# Patient Record
Sex: Male | Born: 1985 | Race: White | Hispanic: No | Marital: Single | Smoking: Current some day smoker
Health system: Southern US, Community
[De-identification: ages and names within clinical notes are randomized; demographics above are authoritative.]

## PROBLEM LIST (undated history)

## (undated) DIAGNOSIS — K589 Irritable bowel syndrome without diarrhea: Secondary | ICD-10-CM

## (undated) DIAGNOSIS — Z8619 Personal history of other infectious and parasitic diseases: Secondary | ICD-10-CM

## (undated) DIAGNOSIS — E781 Pure hyperglyceridemia: Secondary | ICD-10-CM

## (undated) HISTORY — DX: Personal history of other infectious and parasitic diseases: Z86.19

## (undated) HISTORY — DX: Pure hyperglyceridemia: E78.1

## (undated) HISTORY — DX: Irritable bowel syndrome, unspecified: K58.9

---
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
## 2007-04-25 ENCOUNTER — Emergency Department (HOSPITAL_COMMUNITY): Admission: EM | Admit: 2007-04-25 | Discharge: 2007-04-25 | Payer: Self-pay | Admitting: Emergency Medicine

## 2007-10-19 HISTORY — PX: APPENDECTOMY: SHX54

## 2008-09-15 ENCOUNTER — Encounter (INDEPENDENT_AMBULATORY_CARE_PROVIDER_SITE_OTHER): Payer: Self-pay | Admitting: General Surgery

## 2008-09-16 ENCOUNTER — Observation Stay (HOSPITAL_COMMUNITY): Admission: EM | Admit: 2008-09-16 | Discharge: 2008-09-16 | Payer: Self-pay | Admitting: Emergency Medicine

## 2009-11-20 IMAGING — CT CT PELVIS W/ CM
2 of 5 series · 17 of 46 positions shown, 19 images · IV contrast (water/omni  & 100 ML OMNI 300)
Comparison: None.

CT ABDOMEN

CLINICAL DATA: Right lower quadrant pain.

CT ABDOMEN AND PELVIS WITH CONTRAST
TECHNIQUE: Multidetector CT imaging of the abdomen and pelvis was
performed using the standard protocol following bolus
administration of intravenous contrast.
Contrast: Rmnipaque-3NN, 100 ml.

[Series 2: routine abdomen · axial · 0.82mm/px · z∈[-514,-64]mm · 14 of 100 slices shown, 16 images]
[im 5/100  soft-tissue]
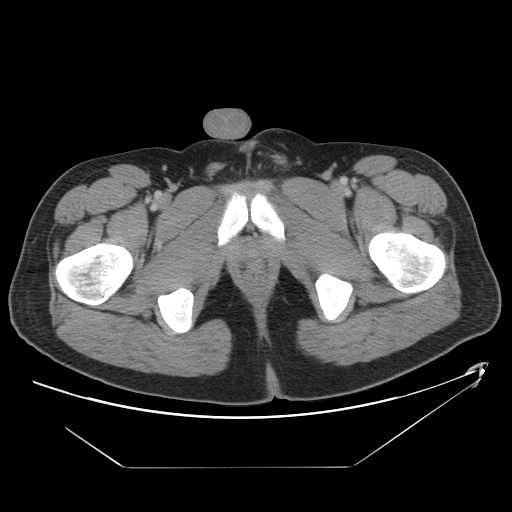
[im 5/100  bone]
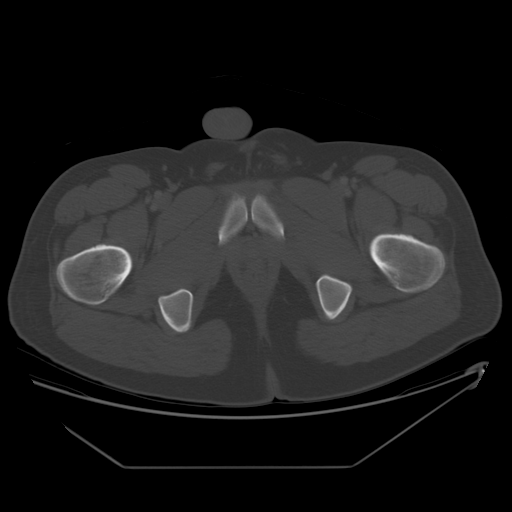
[im 15/100  soft-tissue]
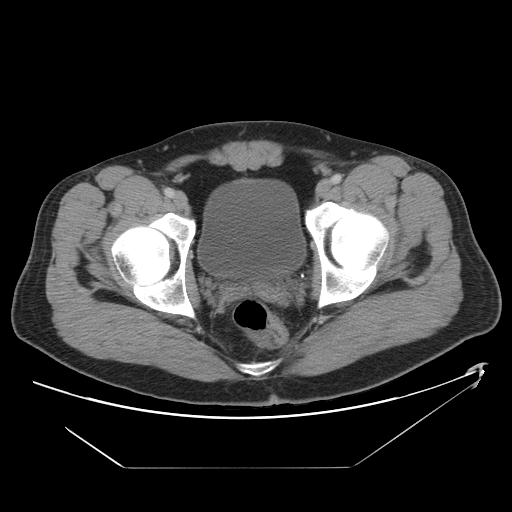
[im 20/100  soft-tissue]
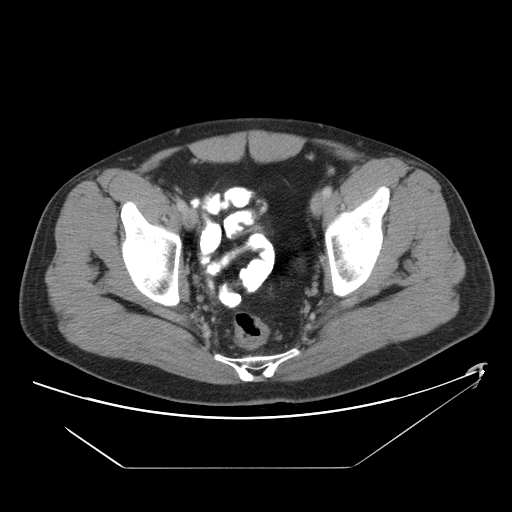
[im 25/100  soft-tissue]
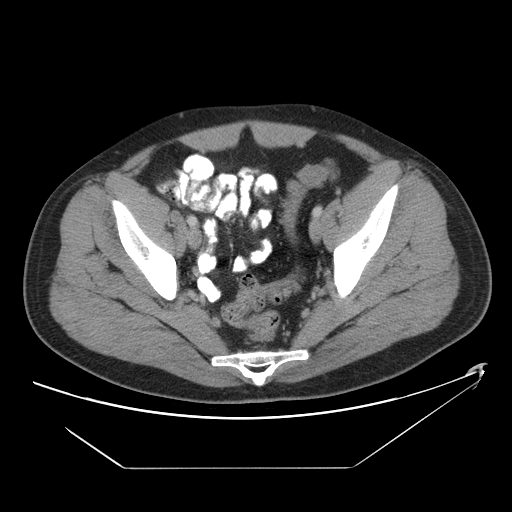
[im 35/100  soft-tissue]
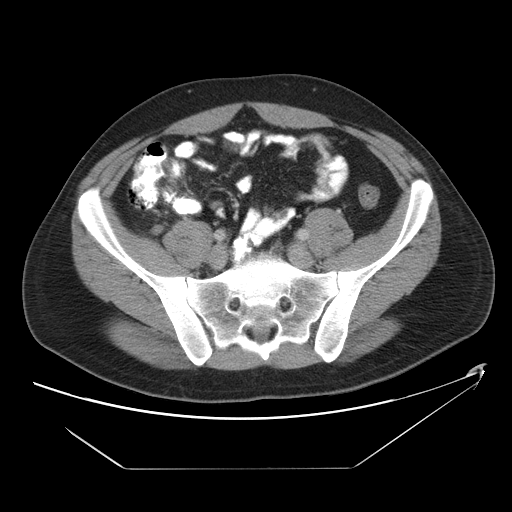
[im 40/100  soft-tissue]
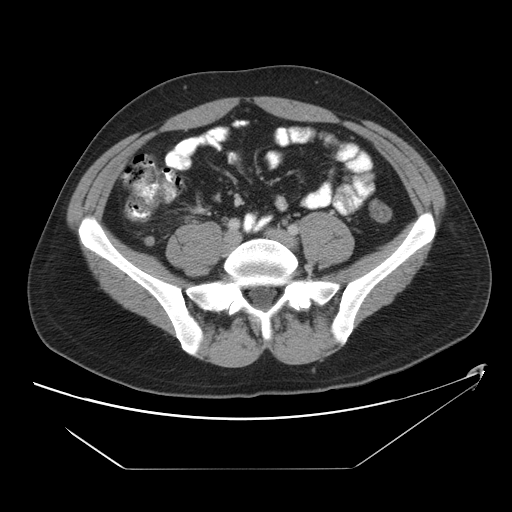
[im 45/100  soft-tissue]
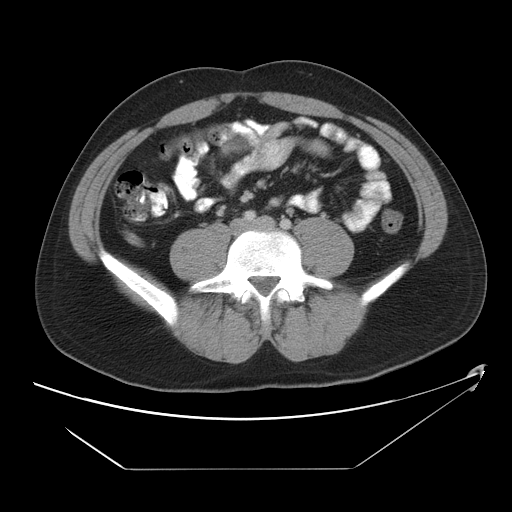
[im 55/100  soft-tissue]
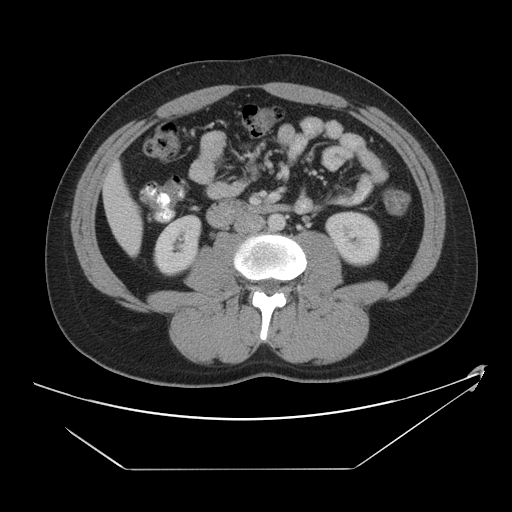
[im 60/100  soft-tissue]
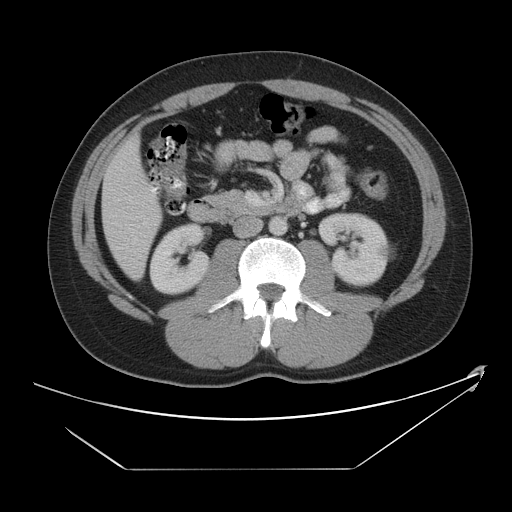
[im 60/100  bone]
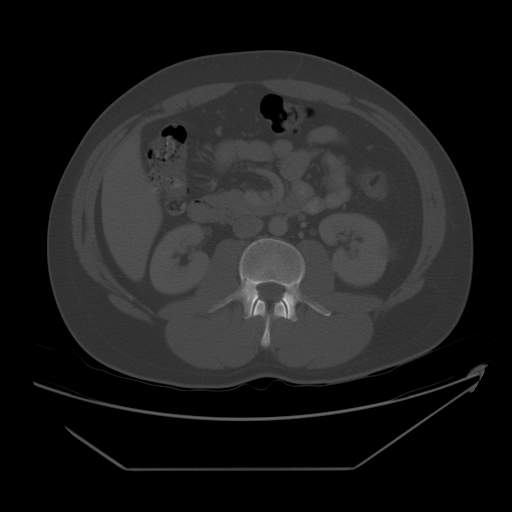
[im 65/100  soft-tissue]
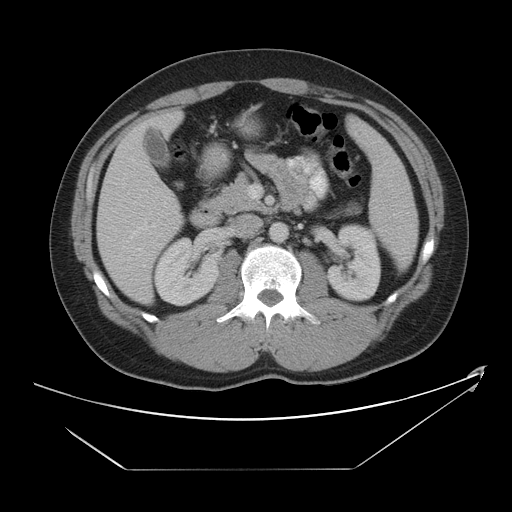
[im 75/100  soft-tissue]
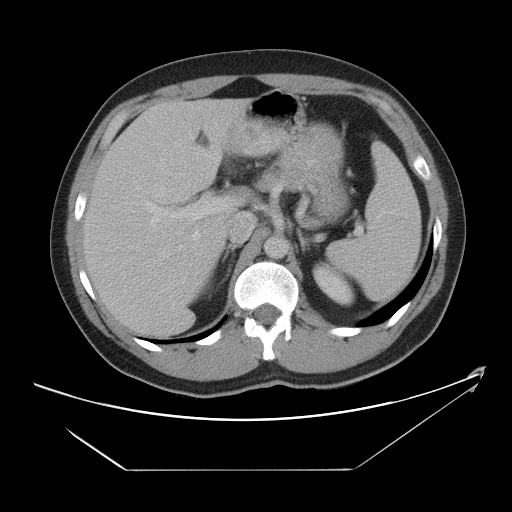
[im 80/100  soft-tissue]
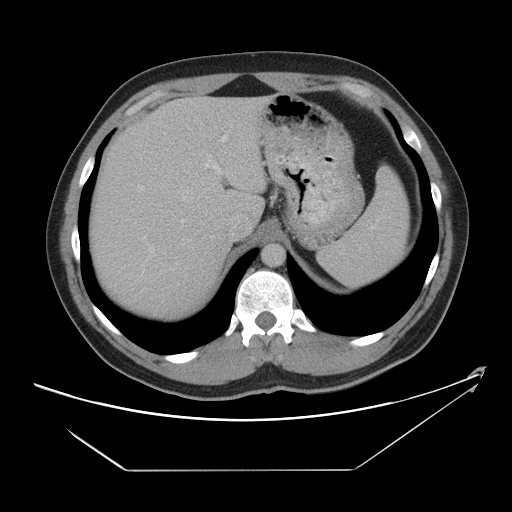
[im 85/100  soft-tissue]
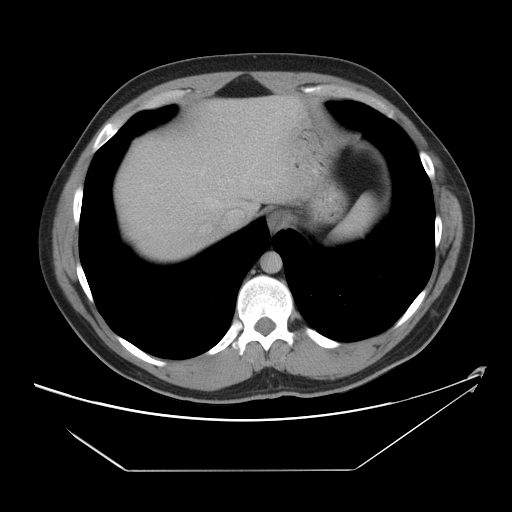
[im 95/100  soft-tissue]
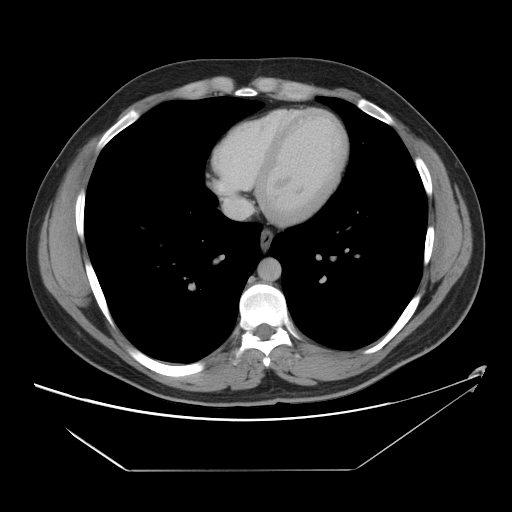

[Series 400: cor · coronal · 0.90mm/px · 3 of 103 slices shown]
[im 35/103  soft-tissue]
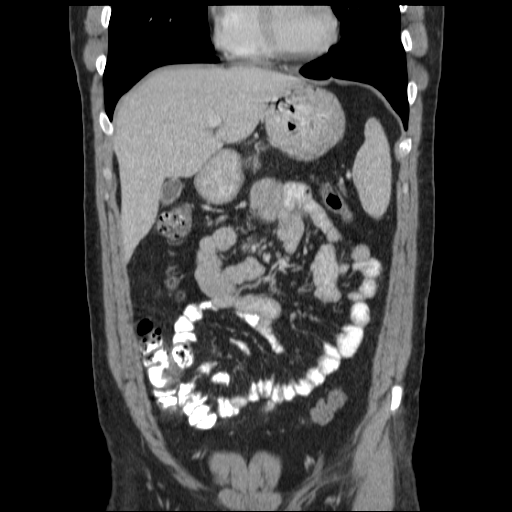
[im 46/103  soft-tissue]
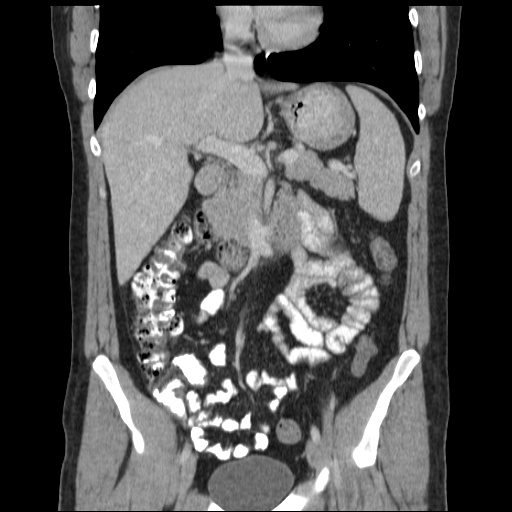
[im 57/103  soft-tissue]
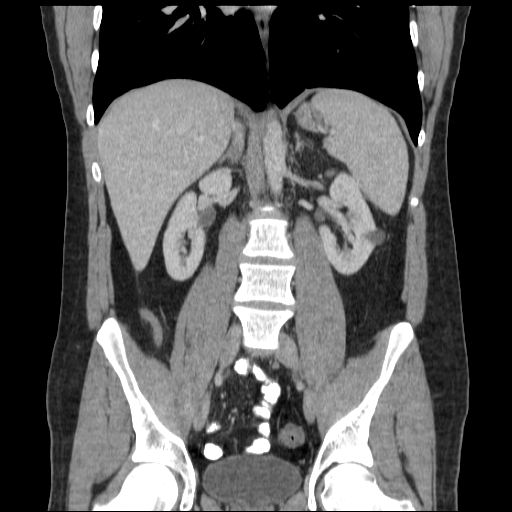

[17 of 46 positions shown; findings below may reference images not displayed]

FINDINGS: Liver, spleen, pancreas, adrenal glands all normal.
Gallbladder normal.  Lung bases clear.  No retroperitoneal vascular
abnormality.  No significant adenopathy.  Incidental 2 cm simple
cyst left kidney. Kidneys otherwise normal. Visualized stomach,
small bowel, colon all normal.  No free fluid or free air.
IMPRESSION: No acute findings.

CT PELVIS
FINDINGS: The appendix is mildly enlarged.  There is early
inflammatory change in the  retrocecal area.  There is no free
fluid or free air.  The findings are consistent with early
appendicitis.  No other bowel abnormalities are seen.  There is no
obstructive uropathy or ureteral calculi.  Bladder and prostate are
unremarkable.  No osseous abnormality.
IMPRESSION: Findings consistent with early appendicitis.

## 2011-03-02 NOTE — Op Note (Signed)
Bradley Holden, Bradley Holden              ACCOUNT NO.:  [id number]   MEDICAL RECORD NO.:  [id number]          PATIENT TYPE:  INP   LOCATION:  5123                         FACILITY:  [facility]   PHYSICIAN:  Ollen Gross. Vernell Morgans, M.D. DATE OF BIRTH:  1986/02/25   DATE OF PROCEDURE:  09/15/2008  DATE OF DISCHARGE:  09/16/2008                               OPERATIVE REPORT   PREOPERATIVE DIAGNOSIS:  Acute appendicitis.   POSTOPERATIVE DIAGNOSIS:  Acute appendicitis.   PROCEDURE:  Laparoscopic appendectomy.   SURGEON:  Ollen Gross. Vernell Morgans, MD   ANESTHESIA:  General endotracheal.   PROCEDURE:  After informed consent was obtained, the patient was brought  to the operating room, placed in a supine position on the operating room  table.  After adequate induction of general anesthesia, the patient's  abdomen was prepped with Betadine and draped in the usual sterile  manner.  The area below the umbilicus was infiltrated with 0.25%  Marcaine.  A small incision was made with a #15 blade knife.  This  incision was carried down through the subcutaneous tissue bluntly with a  hemostat and [employer] retractors until the linea alba was identified.  Linea alba was incised with a #15 blade knife and each side was grasped  with Kocher clamps and elevated anteriorly.  The preperitoneal space was  then probed bluntly with a hemostat until the peritoneum was opened and  access was gained to the abdominal cavity.  A 0 Vicryl pursestring  stitch was placed in the fascia around the opening.  A Hasson cannula  was placed through the opening and anchored in place at the previously  placed Vicryl pursestring stitch.  The abdomen was then insufflated with  carbon dioxide without difficulty.  The patient was placed in  Trendelenburg position, rotated with the right side up.  A laparoscope  was inserted through the Hasson cannula and the right lower quadrant was  inspected.  Next, the suprapubic area was infiltrated with 0.25%  Marcaine.  A small incision was made with a #15 blade knife and a 10-mm  port was placed bluntly through this incision into the abdominal cavity  under direct vision.  Site was chosen between the 2 ports for placement  of a 5-mm port.  This area was infiltrated with 0.25% Marcaine.  A small  stab incision was made with a #15 blade knife and a 5-mm port was placed  bluntly through this incision into the abdominal cavity under direct  vision.  The laparoscope was then moved to the suprapubic port.  Using a  Glassman grasper and harmonic scalpel, the right lower quadrant was  again examined.  The appendix was identified.  The appendix was able to  be elevated anteriorly with the [facility] grasper.  The appendix did  appear to be slightly enlarged and inflamed.  The mesoappendix was taken  down sharply with the harmonic scalpel.  Once the base of the appendix  at its junction with the cecum was cleared of any tissue, then a  laparoscopic GIA 45-mm blue load 6-row stapler was placed through the  Hasson cannula and  placed across the base of the appendix at its  junction with the cecum clamped and fired thereby dividing, thereby  dividing the base of the appendix between staple lines.  A laparoscopic  bag was then inserted through the Hasson cannula.  The appendix was  placed in the bag and the bag was sealed.  The abdomen was then  irrigated with copious amounts of saline until the effluent was clear.  The staple line had a little bit of blood oozing from the staple line.  This was controlled with a couple of endoclips.  Once this was  accomplished, the staple line look good and intact and healthy and  hemostatic.  The appendix was then removed in the bag with the Hasson  cannula through the infraumbilical port without difficulty.  The fascial  defect was then closed with previously placed Vicryl pursestring stitch  as well as with another figure-of-eight 0 Vicryl stitch.  The rest of  the ports  were then removed under direct vision, and the gas was allowed  to escape.  Skin incisions were irrigated with copious amounts of saline  and then closed with interrupted 4-0  Monocryl subcuticular stitches.  Dermabond dressings were applied.  The  patient tolerated the procedure well.  At the end of the case, all  needle, sponge, and instrument counts were correct.  The patient was  then awakened, taken to recovery room in stable condition.      Ollen Gross. Vernell Morgans, M.D.  Electronically Signed     PST/MEDQ  D:  09/16/2008  T:  09/16/2008  Job:  [id number]

## 2011-03-02 NOTE — H&P (Signed)
Bradley Holden, Bradley Holden              ACCOUNT NO.:  [id number]   MEDICAL RECORD NO.:  [id number]          PATIENT TYPE:  INP   LOCATION:  5123                         FACILITY:  [facility]   PHYSICIAN:  Ollen Gross. Vernell Morgans, M.D. DATE OF BIRTH:  [birth year]   DATE OF ADMISSION:  09/15/2008  DATE OF DISCHARGE:                              HISTORY & PHYSICAL   PREOPERATIVE DIAGNOSIS:  Acute appendicitis.   CHIEF COMPLAINT:  Right lower quadrant pain.   Bradley Holden is a 25 year old white male who began having right lower  quadrant abdominal pain this past Friday.  The pain was worse on Friday,  it was associated with some nausea and vomiting.  He denies any fevers  or chills.  No chest pain or shortness of breath.  No diarrhea or  dysuria.  The pain has persisted since Friday, but is not as severe as  it was then.   OTHER REVIEW OF SYSTEMS:  Unremarkable.   PAST MEDICAL HISTORY:  None.   PAST SURGICAL HISTORY:  None.   MEDICATIONS:  None.   ALLERGIES:  None.   SOCIAL HISTORY:  He smokes about a pack of cigarettes a day and  occasionally drinks alcohol.   FAMILY HISTORY:  Noncontributory.   PHYSICAL EXAMINATION:  VITAL SIGNS:  He is afebrile with stable vitals.  GENERAL:  He is a well-developed, well-nourished white male in no acute  distress.  SKIN:  Warm and dry.  No jaundice.  HEENT:  Eyes are anicteric.  Extraocular movements are intact.  Pupils  are equal, round, and reactive to light.  Sclerae nonicteric.  LUNGS:  Clear bilaterally with no use of accessory respiratory muscles.  HEART:  Regular rate and rhythm with impulse in the left chest.  ABDOMEN:  Soft with some mild right lower quadrant tenderness, but no  guarding or peritoneal signs.  No palpable mass or hepatosplenomegaly.  EXTREMITIES:  No cyanosis, clubbing, or edema with good strength in arms  and legs.  PSYCHOLOGIC:  He is alert and oriented x3 with no evidence of anxiety or  depression.   On review of his lab work,  his white count was normal.  On review of his  CT, he did have an enlarged inflamed appendix that appeared to be  retrocecal, no evidence of abscess or perforation.   ASSESSMENT AND PLAN:  This is a 25 year old white male with what appears  to be acute appendicitis.  Because of the risk of perforation and  sepsis, I think he would benefit from having his appendix removed.  He  would also like  to have this done.  I have explained him in detail the risks and  benefits of the operation to remove the appendix as well as some of the  technical aspects, and he understands and wishes to proceed.  We will  obtain some routine preoperative lab work and preparation in doing this  for tonight.      Ollen Gross. Vernell Morgans, M.D.  Electronically Signed     PST/MEDQ  D:  09/15/2008  T:  09/16/2008  Job:  [id number]

## 2011-07-20 LAB — COMPREHENSIVE METABOLIC PANEL
Alkaline Phosphatase: 72 U/L (ref 39–117)
Creatinine, Ser: 0.98 mg/dL (ref 0.4–1.5)
Sodium: 138 mEq/L (ref 135–145)
Total Bilirubin: 0.8 mg/dL (ref 0.3–1.2)

## 2011-07-20 LAB — CBC
Hemoglobin: 16.8 g/dL (ref 13.0–17.0)
MCHC: 34.1 g/dL (ref 30.0–36.0)
Platelets: 167 10*3/uL (ref 150–400)
WBC: 6.2 10*3/uL (ref 4.0–10.5)

## 2011-07-20 LAB — DIFFERENTIAL
Eosinophils Relative: 4 % (ref 0–5)
Lymphocytes Relative: 24 % (ref 12–46)
Lymphs Abs: 1.5 10*3/uL (ref 0.7–4.0)
Monocytes Absolute: 0.5 10*3/uL (ref 0.1–1.0)
Neutro Abs: 3.9 10*3/uL (ref 1.7–7.7)
Neutrophils Relative %: 63 % (ref 43–77)

## 2011-07-20 LAB — LIPASE, BLOOD: Lipase: 22 U/L (ref 11–59)

## 2013-05-30 ENCOUNTER — Ambulatory Visit (INDEPENDENT_AMBULATORY_CARE_PROVIDER_SITE_OTHER): Payer: BC Managed Care – PPO | Admitting: Internal Medicine

## 2013-05-30 ENCOUNTER — Other Ambulatory Visit: Payer: BC Managed Care – PPO

## 2013-05-30 ENCOUNTER — Encounter: Payer: Self-pay | Admitting: Internal Medicine

## 2013-05-30 VITALS — BP 114/82 | HR 73 | Temp 98.0°F | Ht 78.0 in | Wt 236.1 lb

## 2013-05-30 DIAGNOSIS — J029 Acute pharyngitis, unspecified: Secondary | ICD-10-CM

## 2013-05-30 MED ORDER — METHYLPREDNISOLONE ACETATE 80 MG/ML IJ SUSP
80.0000 mg | Freq: Once | INTRAMUSCULAR | Status: AC
  Administered 2013-05-30: 80 mg via INTRAMUSCULAR

## 2013-05-30 NOTE — Addendum Note (Signed)
Addended by: Deatra James on: 05/30/2013 12:15 PM   Modules accepted: Orders

## 2013-05-30 NOTE — Progress Notes (Signed)
Subjective:    Patient ID: Bradley Holden, male    DOB: 08-17-1986, 27 y.o.   MRN: [id number]  HPI  Pt presents to the clinic today with c/o nasal congestion, fatigue and sore throat. The sore throat is mild. He denies fever, chills or body aches. He has been gargling with salt water and taking aspirin. That has helped some. He has no history of allergies or asthma. He has had sick contacts. His main concern is that he lives with his mom who is immunocompromised due to being on methotrexate.   Review of Systems  Past Medical History  Diagnosis Date  . History of chicken pox     No current outpatient prescriptions on file.   No current facility-administered medications for this visit.    No Known Allergies  Family History  Problem Relation Age of Onset  . Arthritis Mother   . Arthritis Father   . Prostate cancer Maternal Grandfather   . Diabetes Other     History   Social History  . Marital Status: Single    Spouse Name: N/A    Number of Children: N/A  . Years of Education: N/A   Occupational History  . Not on file.   Social History Main Topics  . Smoking status: Current Every Day Smoker  . Smokeless tobacco: Not on file  . Alcohol Use: Yes  . Drug Use: No  . Sexual Activity: Not on file   Other Topics Concern  . Not on file   Social History Narrative  . No narrative on file     Constitutional: Pt reports fatigue. Denies fever, malaise, headache or abrupt weight changes.  HEENT: Pt reports sore throat. Denies eye pain, eye redness, ear pain, ringing in the ears, wax buildup, runny nose, nasal congestion, bloody nose. Respiratory: Denies difficulty breathing, shortness of breath, cough or sputum production.   Neurological: Denies dizziness, difficulty with memory, difficulty with speech or problems with balance and coordination.   No other specific complaints in a complete review of systems (except as listed in HPI above).     Objective:   Physical  Exam   BP 114/82  Pulse 73  Temp(Src) 98 F (36.7 C) (Oral)  Ht 6\' 6"  (1.981 m)  Wt 236 lb 1.9 oz (107.103 kg)  BMI 27.29 kg/m2  SpO2 97% Wt Readings from Last 3 Encounters:  05/30/13 236 lb 1.9 oz (107.103 kg)    General: Appears her stated age, well developed, well nourished in NAD. HEENT: Head: normal shape and size; Eyes: sclera white, no icterus, conjunctiva pink, PERRLA and EOMs intact; Ears: Tm's gray and intact, normal light reflex; Nose: mucosa pink and moist, septum midline; Throat/Mouth: Teeth present, mucosa erythematous and moist, no exudate, lesions or ulcerations noted.  Neck: Normal range of motion. Neck supple, trachea midline. No massses, lumps or thyromegaly present.  Cardiovascular: Normal rate and rhythm. S1,S2 noted.  No murmur, rubs or gallops noted. No JVD or BLE edema. No carotid bruits noted. Pulmonary/Chest: Normal effort and positive vesicular breath sounds. No respiratory distress. No wheezes, rales or ronchi noted.   BMET    Component Value Date/Time   NA 138 09/15/2008 1727   K 3.6 09/15/2008 1727   CL 105 09/15/2008 1727   CO2 26 09/15/2008 1727   GLUCOSE 104* 09/15/2008 1727   BUN 13 09/15/2008 1727   CREATININE 0.98 09/15/2008 1727   CALCIUM 9.7 09/15/2008 1727   GFRNONAA >60 09/15/2008 1727   GFRAA  Value: >60  The eGFR has been calculated using the MDRD equation. This calculation has not been validated in all clinical 09/15/2008 1727    Lipid Panel  No results found for this basename: chol, trig, hdl, cholhdl, vldl, ldlcalc    CBC    Component Value Date/Time   WBC 6.2 09/15/2008 1727   RBC 5.41 09/15/2008 1727   HGB 16.8 09/15/2008 1727   HCT 49.4 09/15/2008 1727   PLT 167 09/15/2008 1727   MCV 91.2 09/15/2008 1727   MCHC 34.1 09/15/2008 1727   RDW 11.6 09/15/2008 1727   LYMPHSABS 1.5 09/15/2008 1727   MONOABS 0.5 09/15/2008 1727   EOSABS 0.2 09/15/2008 1727   BASOSABS 0.0 09/15/2008 1727    Hgb A1C No results found  for this basename: HGBA1C        Assessment & Plan:   Acute pharyngitis, likely viral:  Will do throat culture, no RST available Will treat with zpack and 80 mg Depo  RTC as needed

## 2013-05-30 NOTE — Addendum Note (Signed)
Addended by: Deatra James on: 05/30/2013 12:03 PM   Modules accepted: Orders

## 2013-05-30 NOTE — Patient Instructions (Signed)

## 2013-05-31 ENCOUNTER — Other Ambulatory Visit: Payer: Self-pay | Admitting: Internal Medicine

## 2013-06-01 ENCOUNTER — Telehealth: Payer: Self-pay | Admitting: Internal Medicine

## 2013-06-01 LAB — CULTURE, GROUP A STREP

## 2013-06-01 NOTE — Telephone Encounter (Signed)
Pt called request result for throat culture that was done 05/30/13. Ermalinda Barrios spoke with Mr. Boen and inform of preliminary result. Please call pt once the final result is back.

## 2013-06-01 NOTE — Telephone Encounter (Signed)
I spoke with the patient and gave the preliminary result of the Strep A test of of no suspicious colonies, not showing positive for Strep A at this time,  but that he will be called when the final result is received. He states that he is feeling better.

## 2013-07-17 ENCOUNTER — Ambulatory Visit: Payer: Self-pay | Admitting: Internal Medicine

## 2013-09-18 ENCOUNTER — Ambulatory Visit (INDEPENDENT_AMBULATORY_CARE_PROVIDER_SITE_OTHER): Payer: BC Managed Care – PPO | Admitting: Family Medicine

## 2013-09-18 ENCOUNTER — Encounter: Payer: Self-pay | Admitting: Family Medicine

## 2013-09-18 VITALS — BP 110/70 | HR 83 | Temp 98.3°F | Ht 78.0 in | Wt 253.0 lb

## 2013-09-18 DIAGNOSIS — R109 Unspecified abdominal pain: Secondary | ICD-10-CM

## 2013-09-18 DIAGNOSIS — K3189 Other diseases of stomach and duodenum: Secondary | ICD-10-CM

## 2013-09-18 DIAGNOSIS — R1013 Epigastric pain: Secondary | ICD-10-CM

## 2013-09-18 LAB — CBC WITH DIFFERENTIAL/PLATELET
Basophils Absolute: 0 10*3/uL (ref 0.0–0.1)
Basophils Relative: 0.4 % (ref 0.0–3.0)
Eosinophils Absolute: 0.2 10*3/uL (ref 0.0–0.7)
Eosinophils Relative: 4.1 % (ref 0.0–5.0)
HCT: 45.5 % (ref 39.0–52.0)
Hemoglobin: 15.8 g/dL (ref 13.0–17.0)
Lymphocytes Relative: 28.2 % (ref 12.0–46.0)
Lymphs Abs: 1.6 10*3/uL (ref 0.7–4.0)
MCHC: 34.8 g/dL (ref 30.0–36.0)
MCV: 88.6 fl (ref 78.0–100.0)
Monocytes Absolute: 0.6 10*3/uL (ref 0.1–1.0)
Monocytes Relative: 11.5 % (ref 3.0–12.0)
Neutro Abs: 3.1 10*3/uL (ref 1.4–7.7)
Neutrophils Relative %: 55.8 % (ref 43.0–77.0)
Platelets: 179 10*3/uL (ref 150.0–400.0)
RBC: 5.14 Mil/uL (ref 4.22–5.81)
RDW: 12.6 % (ref 11.5–14.6)
WBC: 5.5 10*3/uL (ref 4.5–10.5)

## 2013-09-18 LAB — BASIC METABOLIC PANEL
BUN: 13 mg/dL (ref 6–23)
CO2: 25 mEq/L (ref 19–32)
Calcium: 9.3 mg/dL (ref 8.4–10.5)
Chloride: 104 mEq/L (ref 96–112)
Creatinine, Ser: 1.1 mg/dL (ref 0.4–1.5)
GFR: 89.11 mL/min (ref >60.00)
Glucose, Bld: 89 mg/dL (ref 70–99)
Potassium: 4 mEq/L (ref 3.5–5.1)
Sodium: 137 mEq/L (ref 135–145)

## 2013-09-18 LAB — HEPATIC FUNCTION PANEL
ALT: 50 U/L (ref 0–53)
AST: 30 U/L (ref 0–37)
Albumin: 4.3 g/dL (ref 3.5–5.2)
Alkaline Phosphatase: 62 U/L (ref 39–117)
Bilirubin, Direct: 0 mg/dL (ref 0.0–0.3)
Total Bilirubin: 0.7 mg/dL (ref 0.3–1.2)
Total Protein: 7.3 g/dL (ref 6.0–8.3)

## 2013-09-18 MED ORDER — OMEPRAZOLE 40 MG PO CPDR: 40.0000 mg | DELAYED_RELEASE_CAPSULE | Freq: Every day | ORAL | Status: DC

## 2013-09-18 NOTE — Progress Notes (Signed)
  Subjective:     Bradley Holden is a 27 y.o. male who presents for evaluation of abdominal pain. Onset was 2 months ago. Symptoms have been gradually worsening. The pain is described as burning, and is 6/10 in intensity. Pain is located in the mid, low abd without radiation.  Aggravating factors: eating.  Alleviating factors: none. Associated symptoms: belching. The patient denies anorexia, arthralagias, chills, constipation, diarrhea, dysuria, fever, flatus, frequency, headache, hematochezia, hematuria, melena, myalgias, nausea, sweats and vomiting.  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     The patient's history has been marked as reviewed and updated as appropriate.  Review of Systems Pertinent items are noted in HPI.     Objective:    BP 110/70  Pulse 83  Temp(Src) 98.3 F (36.8 C) (Oral)  Ht 6\' 6"  (1.981 m)  Wt 253 lb (114.76 kg)  BMI 29.24 kg/m2  SpO2 98% General appearance: alert, cooperative, appears stated age and no distress Lungs: clear to auscultation bilaterally Heart: S1, S2 normal Abdomen: soft, non-tender; bowel sounds normal; no masses,  no organomegaly    Assessment:    Abdominal pain, likely secondary to gerd .    Plan:    omeprazole daily Check labs Bland diet Check labs

## 2013-09-18 NOTE — Progress Notes (Signed)
Pre visit review using our clinic review tool, if applicable. No additional management support is needed unless otherwise documented below in the visit note. 

## 2013-09-18 NOTE — Patient Instructions (Signed)

## 2013-11-20 ENCOUNTER — Telehealth: Payer: Self-pay

## 2013-11-20 NOTE — Telephone Encounter (Signed)
Medication List and allergies:  Updated and Reviewed  90 day supply/mail order: n/a Local prescriptions:  [id number] - [place], [place] - [address]  Immunization due:  Influenza- Declined  A/P: No changes to personal, family or PSH Flu- declined Tdap- 2008 per patient    To discuss with provider: Patient currently experience back pain.  May have a pulled muscle per patient.

## 2013-11-21 ENCOUNTER — Encounter (INDEPENDENT_AMBULATORY_CARE_PROVIDER_SITE_OTHER): Payer: Self-pay

## 2013-11-21 ENCOUNTER — Encounter: Payer: Self-pay | Admitting: Family Medicine

## 2013-11-21 ENCOUNTER — Ambulatory Visit (INDEPENDENT_AMBULATORY_CARE_PROVIDER_SITE_OTHER): Payer: No Typology Code available for payment source | Admitting: Family Medicine

## 2013-11-21 VITALS — BP 110/72 | HR 88 | Temp 98.4°F | Resp 16 | Ht 77.75 in | Wt 237.4 lb

## 2013-11-21 DIAGNOSIS — M5416 Radiculopathy, lumbar region: Secondary | ICD-10-CM | POA: Insufficient documentation

## 2013-11-21 DIAGNOSIS — Z0000 Encounter for general adult medical examination without abnormal findings: Secondary | ICD-10-CM

## 2013-11-21 DIAGNOSIS — IMO0002 Reserved for concepts with insufficient information to code with codable children: Secondary | ICD-10-CM

## 2013-11-21 MED ORDER — PREDNISONE 10 MG PO TABS: ORAL_TABLET | ORAL | Status: DC

## 2013-11-21 NOTE — Patient Instructions (Signed)
Follow up as needed Start the Prednisone as directed- take w/ food We'll notify you of your lab results and make any changes if needed Call with any questions or concerns- particularly if the back is not getting better Welcome!  We're glad to have you!

## 2013-11-21 NOTE — Progress Notes (Signed)
Pre visit review using our clinic review tool, if applicable. No additional management support is needed unless otherwise documented below in the visit note. 

## 2013-11-21 NOTE — Progress Notes (Signed)
   Subjective:    Patient ID: Bradley Holden, male    DOB: 1986-10-18, 28 y.o.   MRN: [id number]  HPI New to establish.  [facility].  Wanted to see MD but had to reschedule new pt appt and then panel was closed.  CPE- exercising regularly but recently injured back (has done this previously and dx'd w/ '2 slipped discs').  R sided pain, radiating into back of leg.  Painful to sit.  Able to sleep on side comfortably.   Review of Systems Patient reports no vision/hearing changes, anorexia, fever ,adenopathy, persistant/recurrent hoarseness, swallowing issues, chest pain, palpitations, edema, persistant/recurrent cough, hemoptysis, dyspnea (rest,exertional, paroxysmal nocturnal), gastrointestinal  bleeding (melena, rectal bleeding), abdominal pain, excessive heart burn, GU symptoms (dysuria, hematuria, voiding/incontinence issues) syncope, focal weakness, memory loss, numbness & tingling, skin/hair/nail changes, depression, anxiety, abnormal bruising/bleeding.     Objective:   Physical Exam BP 110/72  Pulse 88  Temp(Src) 98.4 F (36.9 C) (Oral)  Resp 16  Ht 6' 5.75" (1.975 m)  Wt 237 lb 6 oz (107.673 kg)  BMI 27.60 kg/m2  SpO2 98%  General Appearance:    Alert, cooperative, no distress, appears stated age  Head:    Normocephalic, without obvious abnormality, atraumatic  Eyes:    PERRL, conjunctiva/corneas clear, EOM's intact, fundi    benign, both eyes       Ears:    Normal TM's and external ear canals, both ears  Nose:   Nares normal, septum midline, mucosa normal, no drainage   or sinus tenderness  Throat:   Lips, mucosa, and tongue normal; teeth and gums normal  Neck:   Supple, symmetrical, trachea midline, no adenopathy;       thyroid:  No enlargement/tenderness/nodules  Back:     Symmetric, no curvature, ROM normal, no CVA tenderness  Lungs:     Clear to auscultation bilaterally, respirations unlabored  Chest wall:    No tenderness or deformity  Heart:     Regular rate and rhythm, S1 and S2 normal, no murmur, rub   or gallop  Abdomen:     Soft, non-tender, bowel sounds active all four quadrants,    no masses, no organomegaly  Genitalia:    Normal male without lesion, discharge or tenderness  Rectal:    Deferred due to young age  Extremities:   Extremities normal, atraumatic, no cyanosis or edema  Pulses:   2+ and symmetric all extremities  Skin:   Skin color, texture, turgor normal, no rashes or lesions  Lymph nodes:   Cervical, supraclavicular, and axillary nodes normal  Neurologic:   CNII-XII intact. Normal strength, sensation and reflexes      Throughout.  (-) SLR bilaterally          Assessment & Plan:

## 2013-11-22 LAB — HEPATIC FUNCTION PANEL
ALT: 49 U/L (ref 0–53)
AST: 31 U/L (ref 0–37)
Albumin: 4.7 g/dL (ref 3.5–5.2)
Alkaline Phosphatase: 76 U/L (ref 39–117)
Bilirubin, Direct: 0 mg/dL (ref 0.0–0.3)
TOTAL PROTEIN: 7.3 g/dL (ref 6.0–8.3)
Total Bilirubin: 0.5 mg/dL (ref 0.3–1.2)

## 2013-11-22 LAB — CBC WITH DIFFERENTIAL/PLATELET
BASOS ABS: 0 10*3/uL (ref 0.0–0.1)
BASOS PCT: 0.4 % (ref 0.0–3.0)
EOS ABS: 0.2 10*3/uL (ref 0.0–0.7)
Eosinophils Relative: 2.6 % (ref 0.0–5.0)
HEMATOCRIT: 47.6 % (ref 39.0–52.0)
HEMOGLOBIN: 16.3 g/dL (ref 13.0–17.0)
Lymphocytes Relative: 28.1 % (ref 12.0–46.0)
Lymphs Abs: 2.2 10*3/uL (ref 0.7–4.0)
MCHC: 34.3 g/dL (ref 30.0–36.0)
MCV: 90.8 fl (ref 78.0–100.0)
Monocytes Absolute: 0.4 10*3/uL (ref 0.1–1.0)
Monocytes Relative: 5.2 % (ref 3.0–12.0)
NEUTROS PCT: 63.7 % (ref 43.0–77.0)
Neutro Abs: 4.9 10*3/uL (ref 1.4–7.7)
Platelets: 209 10*3/uL (ref 150.0–400.0)
RBC: 5.25 Mil/uL (ref 4.22–5.81)
RDW: 12.2 % (ref 11.5–14.6)
WBC: 7.7 10*3/uL (ref 4.5–10.5)

## 2013-11-22 LAB — BASIC METABOLIC PANEL
BUN: 12 mg/dL (ref 6–23)
CALCIUM: 9.7 mg/dL (ref 8.4–10.5)
CO2: 27 mEq/L (ref 19–32)
CREATININE: 1.1 mg/dL (ref 0.4–1.5)
Chloride: 105 mEq/L (ref 96–112)
GFR: 84.35 mL/min (ref >60.00)
GLUCOSE: 88 mg/dL (ref 70–99)
Potassium: 3.9 mEq/L (ref 3.5–5.1)
SODIUM: 139 meq/L (ref 135–145)

## 2013-11-22 LAB — TSH: TSH: 0.75 u[IU]/mL (ref 0.35–5.50)

## 2013-11-22 LAB — LDL CHOLESTEROL, DIRECT: LDL DIRECT: 92.7 mg/dL

## 2013-11-22 LAB — LIPID PANEL
CHOL/HDL RATIO: 7
CHOLESTEROL: 193 mg/dL (ref 0–200)
HDL: 26.9 mg/dL — AB (ref >39.00)
TRIGLYCERIDES: 705 mg/dL — AB (ref 0.0–149.0)
VLDL: 141 mg/dL — ABNORMAL HIGH (ref 0.0–40.0)

## 2013-11-23 ENCOUNTER — Telehealth: Payer: Self-pay | Admitting: Family Medicine

## 2013-11-23 NOTE — Telephone Encounter (Signed)
Relevant patient education assigned to patient using Emmi. ° °

## 2013-11-25 NOTE — Assessment & Plan Note (Signed)
Pt's PE WNL w/ exception of lumbar radiculopathy.  Check labs.  Anticipatory guidance provided.

## 2013-11-25 NOTE — Assessment & Plan Note (Signed)
New.  Start pred taper and muscle relaxer.  If no improvement will need referral to ortho for ongoing evaluation and tx.  Reviewed supportive care and red flags that should prompt return.  Pt expressed understanding and is in agreement w/ plan.

## 2013-11-26 ENCOUNTER — Other Ambulatory Visit: Payer: Self-pay | Admitting: General Practice

## 2013-11-26 ENCOUNTER — Encounter: Payer: Self-pay | Admitting: General Practice

## 2013-11-26 DIAGNOSIS — E781 Pure hyperglyceridemia: Secondary | ICD-10-CM

## 2013-11-26 MED ORDER — FENOFIBRATE 160 MG PO TABS: 160.0000 mg | ORAL_TABLET | Freq: Every day | ORAL | Status: DC

## 2013-12-12 ENCOUNTER — Ambulatory Visit (INDEPENDENT_AMBULATORY_CARE_PROVIDER_SITE_OTHER): Payer: No Typology Code available for payment source | Admitting: Nurse Practitioner

## 2013-12-12 ENCOUNTER — Encounter: Payer: Self-pay | Admitting: Nurse Practitioner

## 2013-12-12 VITALS — BP 105/74 | HR 81 | Temp 98.2°F | Wt 233.2 lb

## 2013-12-12 DIAGNOSIS — R140 Abdominal distension (gaseous): Secondary | ICD-10-CM | POA: Insufficient documentation

## 2013-12-12 DIAGNOSIS — R142 Eructation: Secondary | ICD-10-CM

## 2013-12-12 DIAGNOSIS — E781 Pure hyperglyceridemia: Secondary | ICD-10-CM | POA: Insufficient documentation

## 2013-12-12 DIAGNOSIS — R143 Flatulence: Secondary | ICD-10-CM

## 2013-12-12 DIAGNOSIS — R141 Gas pain: Secondary | ICD-10-CM

## 2013-12-12 LAB — AMYLASE: Amylase: 33 U/L (ref 27–131)

## 2013-12-12 LAB — LIPASE: Lipase: 31 U/L (ref 11.0–59.0)

## 2013-12-12 MED ORDER — SIMETHICONE 80 MG PO CHEW: 80.0000 mg | CHEWABLE_TABLET | Freq: Four times a day (QID) | ORAL | Status: DC | PRN

## 2013-12-12 NOTE — Patient Instructions (Signed)
I am not certain what is causing your discomfort. A few possibilities are lactose intolerance, wheat intolerance, pancreatitis. Lab tests and ultrasound will help to make diagnosis. In meantime, please do not eat dairy products-no creamy soups, milk, cheese.  Regarding high triglycerides, cut back on alcohol intake, sugar and refined flours. Consider starting fish oil capsules 2-3 capsules daily to get 2000 mg EPA/DHA (read label). Consider starting medication prescribed by Dr Beverely Lowabori.  Lactose Intolerance, Adult Lactose intolerance is when the body is not able to digest lactose, a sugar found in milk and milk products. Lactose intolerance is caused by your body not producing enough of the enzyme lactase. When there is not enough lactase to digest the amount of lactose consumed, discomfort may be felt. Lactose intolerance is not a milk allergy. For most people, lactase deficiency is a condition that develops naturally over time. After about the age of 2, the body begins to produce less lactase. But many people may not experience symptoms until they are much older. CAUSES Things that can cause you to be lactose intolerant include:  Aging.  Being born without the ability to make lactase.  Certain digestive diseases.  Injuries to the small intestine. SYMPTOMS   Feeling sick to your stomach (nauseous).  Diarrhea.  Cramps.  Bloating.  Gas. Symptoms usually show up a half hour or 2 hours after eating or drinking products containing lactose. TREATMENT  No treatment can improve the body's ability to produce lactase. However, symptoms can be controlled through diet. A medicine may be given to you to take when you consume lactose-containing foods or drinks. The medicine contains the lactase enzyme, which help the body digest lactose better. HOME CARE INSTRUCTIONS  Eat or drink dairy products as told by your caregiver or dietician.  Take all medicine as directed by your caregiver.  Find  lactose-free or lactose-reduced products at your local grocery store.  Talk to your caregiver or dietician to decide if you need any dietary supplements. The following is the amount of calcium needed from the diet:  19 to 50 years: 1000 mg  Over 50 years: 1200 mg Calcium and Lactose in Common Foods Non-Dairy Products / Calcium Content (mg)  Calcium-fortified orange juice, 1 cup / 308 to 344 mg  Sardines, with edible bones, 3 oz / 270 mg  Salmon, canned, with edible bones, 3 oz / 205 mg  Soymilk, fortified, 1 cup / 200 mg  Broccoli (raw), 1 cup / 90 mg  Orange, 1 medium / 50 mg  Pinto beans,  cup / 40 mg  Tuna, canned, 3 oz / 10 mg  Lettuce greens,  cup / 10 mg Dairy Products / Calcium Content (mg) / Lactose Content (g)  Yogurt, plain, low-fat, 1 cup / 415 mg / 5 g  Milk, reduced fat, 1 cup / 295 mg / 11 g  Swiss cheese, 1 oz / 270 mg / 1 g  Ice cream,  cup / 85 mg / 6 g  Cottage cheese,  cup / 75 mg / 2 to 3 g SEEK MEDICAL CARE IF: You have no relief from your symptoms. Document Released: 10/04/2005 Document Revised: 12/27/2011 Document Reviewed: 01/01/2011 [facility] Patient Information 2014 [place], [place].

## 2013-12-12 NOTE — Assessment & Plan Note (Signed)
Adv to cut back ETOH, sugar, refined flour. Consider 2-3 capsules fish oil daily or eat 6 oz salmon weekly; & Start medication prescribed.

## 2013-12-12 NOTE — Progress Notes (Signed)
   Subjective:    Patient ID: Bradley Holden, male    DOB: 07/13/1986, 28 y.o.   MRN: [id number]  Abdominal Pain This is a recurrent problem. The current episode started in the past 7 days (4 da. Pt states similar to bloating he had last Dec. , but pain is different-like pain he had before appendix was removed.). The onset quality is sudden (felt tired Sat. then woke w/abd pain Sunday, progressed to bloating. Pain has eased, still feels bloated, has discomfort w/movement.). The problem occurs constantly. The most recent episode lasted 3 days. The problem has been gradually improving. The pain is located in the generalized abdominal region. The pain is moderate. The quality of the pain is aching. The abdominal pain does not radiate. Associated symptoms include anorexia, belching, diarrhea (loose stools 3d) and flatus. Pertinent negatives include no arthralgias, constipation, dysuria, fever, headaches, hematochezia, melena, myalgias, nausea or vomiting. The pain is aggravated by movement. The pain is relieved by being still. He has tried nothing for the symptoms. His past medical history is significant for abdominal surgery (appendectomy) and GERD (started omeprazole 7562m ago).      Review of Systems  Constitutional: Positive for appetite change (eating mostly soups w/some solids, drinking milk (instant breakfast), sweet tea). Negative for fever, chills, activity change and fatigue.  HENT: Negative for congestion and sore throat.   Respiratory: Negative for cough, chest tightness, shortness of breath and wheezing.   Cardiovascular: Negative for chest pain.  Gastrointestinal: Positive for abdominal pain, diarrhea (loose stools 3d), abdominal distention, anorexia and flatus. Negative for nausea, vomiting, constipation, melena and hematochezia.  Genitourinary: Negative for dysuria, flank pain and testicular pain.  Musculoskeletal: Negative for arthralgias, back pain and myalgias.  Neurological: Negative  for headaches.  Hematological: Negative for adenopathy.  Psychiatric/Behavioral:       Admits to binge drinking -5 beers & 5 shots liquor every Friday & sundays during football season.       Objective:   Physical Exam  Vitals reviewed. Constitutional: He is oriented to person, place, and time. He appears well-developed and well-nourished. No distress.  HENT:  Head: Normocephalic and atraumatic.  Right Ear: External ear normal.  Left Ear: External ear normal.  Eyes: Conjunctivae are normal. Right eye exhibits no discharge. Left eye exhibits no discharge.  Cardiovascular: Normal rate.   Pulmonary/Chest: Effort normal.  Abdominal: Soft. He exhibits distension. He exhibits no mass. There is tenderness (low abd tenderness RLQ, suprapubic, LLQ). There is no rebound and no guarding.  Neurological: He is alert and oriented to person, place, and time.  Skin: Skin is warm and dry.  Psychiatric: He has a normal mood and affect. His behavior is normal. Thought content normal.          Assessment & Plan:  1. Abdominal bloating 3d, loose stools, sudden abd pain , resolving DD: lactose intolerance, celiac, viral gastroenteritis, food poisoning, IBD, IBS, Pancreatitis (high triglyceridemia) - Fecal lactoferrin - Hemoccult Cards (X3 cards); Future - Amylase - Lipase - Tissue Transglutaminase, IGA - Tissue Transglutaminase, IGG - US Abdomen Complete; Future - simethicone (GAS-X) 80 MG chewable tablet; Chew 1 tablet (80 mg total) by mouth every 6 (six) hours as needed for flatulence.  Dispense: 30 tablet; Refill: 0  F/u 2 weeks. See pt instructions.  2 hypertiglyceridemia See A&P in prob list.

## 2013-12-12 NOTE — Progress Notes (Signed)
Pre visit review using our clinic review tool, if applicable. No additional management support is needed unless otherwise documented below in the visit note. 

## 2013-12-13 LAB — TISSUE TRANSGLUTAMINASE, IGA: TISSUE TRANSGLUTAMINASE AB, IGA: 4.8 U/mL (ref <20)

## 2013-12-13 LAB — TISSUE TRANSGLUTAMINASE, IGG: Tissue Transglut Ab: 4.8 U/mL (ref <20)

## 2013-12-14 ENCOUNTER — Telehealth: Payer: Self-pay | Admitting: Family Medicine

## 2013-12-14 NOTE — Telephone Encounter (Signed)
Relevant patient education assigned to patient using Emmi. ° °

## 2013-12-17 ENCOUNTER — Ambulatory Visit
Admission: RE | Admit: 2013-12-17 | Discharge: 2013-12-17 | Disposition: A | Discharge status: Home / Self Care | Payer: No Typology Code available for payment source | Source: Ambulatory Visit | Attending: Nurse Practitioner | Admitting: Nurse Practitioner

## 2013-12-17 ENCOUNTER — Telehealth: Payer: Self-pay | Admitting: Nurse Practitioner

## 2013-12-17 DIAGNOSIS — G8929 Other chronic pain: Secondary | ICD-10-CM

## 2013-12-17 DIAGNOSIS — R1031 Right lower quadrant pain: Principal | ICD-10-CM

## 2013-12-17 DIAGNOSIS — R140 Abdominal distension (gaseous): Secondary | ICD-10-CM

## 2013-12-18 NOTE — Telephone Encounter (Signed)
Patient has an appt 12/25/13

## 2013-12-25 ENCOUNTER — Ambulatory Visit (INDEPENDENT_AMBULATORY_CARE_PROVIDER_SITE_OTHER): Payer: No Typology Code available for payment source | Admitting: General Surgery

## 2013-12-25 ENCOUNTER — Encounter (INDEPENDENT_AMBULATORY_CARE_PROVIDER_SITE_OTHER): Payer: Self-pay | Admitting: General Surgery

## 2013-12-25 VITALS — BP 130/76 | HR 80 | Temp 98.5°F | Ht 78.0 in | Wt 234.0 lb

## 2013-12-25 DIAGNOSIS — K824 Cholesterolosis of gallbladder: Secondary | ICD-10-CM

## 2013-12-25 NOTE — Progress Notes (Signed)
Patient ID: Bradley Holden, male   DOB: September 06, 1986, 28 y.o.   MRN: [id number]  Chief Complaint  Patient presents with  . Abdominal Pain    new pt    HPI Bradley Holden is a 28 y.o. male.  We're asked to see the patient in consultation by Dr. Beverely Lowabori to evaluate him for a gallbladder polyp. The patient is a 28 year old white male who first started developing bloating around Thanksgiving. The bloating occurs pretty much every day. He has discomfort that comes and goes with that. The discomfort is mostly in his left lower quadrant. He denies any nausea or vomiting. As part of his workup he underwent an ultrasound that showed 2 gallbladder polyps but no gallstones and no inflammation of the gallbladder wall and no ductal dilatation.  HPI  Past Medical History  Diagnosis Date  . History of chicken pox   . IBS (irritable bowel syndrome)     Past Surgical History  Procedure Laterality Date  . Appendectomy  2009    Family History  Problem Relation Age of Onset  . Arthritis Mother   . Arthritis Father   . Prostate cancer Maternal Grandfather   . Diabetes Other     Social History History  Substance Use Topics  . Smoking status: Current Every Day Smoker  . Smokeless tobacco: Not on file  . Alcohol Use: 6.0 oz/week    12 drink(s) per week     Comment: 12 pack beer ,  1/2 pint liquor on weekends    No Known Allergies  Current Outpatient Prescriptions  Medication Sig Dispense Refill  . omeprazole (PRILOSEC) 40 MG capsule Take 1 capsule (40 mg total) by mouth daily.  30 capsule  3  . simethicone (GAS-X) 80 MG chewable tablet Chew 1 tablet (80 mg total) by mouth every 6 (six) hours as needed for flatulence.  30 tablet  0   No current facility-administered medications for this visit.    Review of Systems Review of Systems  Constitutional: Negative.   HENT: Negative.   Eyes: Negative.   Respiratory: Negative.   Cardiovascular: Negative.   Gastrointestinal: Positive for  abdominal pain, constipation and abdominal distention.  Endocrine: Negative.   Genitourinary: Negative.   Musculoskeletal: Negative.   Skin: Negative.   Allergic/Immunologic: Negative.   Neurological: Negative.   Hematological: Negative.   Psychiatric/Behavioral: Negative.     Blood pressure 130/76, pulse 80, temperature 98.5 F (36.9 C), temperature source Oral, height 6\' 6"  (1.981 m), weight 234 lb (106.142 kg).  Physical Exam Physical Exam  Constitutional: He is oriented to person, place, and time. He appears well-developed and well-nourished.  HENT:  Head: Normocephalic and atraumatic.  Eyes: Conjunctivae and EOM are normal. Pupils are equal, round, and reactive to light.  Neck: Normal range of motion. Neck supple.  Cardiovascular: Normal rate, regular rhythm and normal heart sounds.   Pulmonary/Chest: Effort normal and breath sounds normal.  Abdominal: Soft. Bowel sounds are normal. He exhibits no mass.  Musculoskeletal: Normal range of motion.  Neurological: He is alert and oriented to person, place, and time.  Skin: Skin is warm and dry.  Psychiatric: He has a normal mood and affect. His behavior is normal.    Data Reviewed As above  Assessment    The patient has left lower quadrant pain and gallbladder polyp seen on ultrasound. The location of his pain would be very atypical for gallbladder type pain and for this reason I can not guarantee him that if we  remove his gallbladder his pain will get better. He also has problems with constipation and I think it would be reasonable to start him on MiraLAX to see if this makes it better. I have discussed with him in detail the risks and benefits of the operation to remove the gallbladder as well as some of the technical aspects and he understands     Plan    He would like to take time to talk to his family and consider the options before planning any surgery. He will start MiraLAX. He will call us if he wants to schedule  removal of his gallbladder.        TOTH III,PAUL S 12/25/2013, 12:29 PM

## 2013-12-25 NOTE — Patient Instructions (Addendum)
Try miralax for constipation Call if you want to schedule lap chole

## 2014-03-05 ENCOUNTER — Other Ambulatory Visit: Payer: Self-pay | Admitting: Family Medicine

## 2014-03-05 NOTE — Telephone Encounter (Signed)
Med filled.  

## 2014-06-11 ENCOUNTER — Other Ambulatory Visit: Payer: Self-pay | Admitting: Dermatology

## 2014-07-09 ENCOUNTER — Ambulatory Visit (INDEPENDENT_AMBULATORY_CARE_PROVIDER_SITE_OTHER): Payer: No Typology Code available for payment source | Admitting: Family Medicine

## 2014-07-09 ENCOUNTER — Encounter: Payer: Self-pay | Admitting: Family Medicine

## 2014-07-09 VITALS — BP 126/72 | HR 66 | Temp 98.0°F | Resp 16 | Wt 227.1 lb

## 2014-07-09 DIAGNOSIS — E781 Pure hyperglyceridemia: Secondary | ICD-10-CM

## 2014-07-09 DIAGNOSIS — B86 Scabies: Secondary | ICD-10-CM

## 2014-07-09 HISTORY — DX: Scabies: B86

## 2014-07-09 LAB — LIPID PANEL
CHOL/HDL RATIO: 7
CHOLESTEROL: 236 mg/dL — AB (ref 0–200)
HDL: 31.8 mg/dL — AB (ref >39.00)
NONHDL: 204.2
Triglycerides: 739 mg/dL — ABNORMAL HIGH (ref 0.0–149.0)
VLDL: 147.8 mg/dL — ABNORMAL HIGH (ref 0.0–40.0)

## 2014-07-09 LAB — HEPATIC FUNCTION PANEL
ALT: 59 U/L — ABNORMAL HIGH (ref 0–53)
AST: 31 U/L (ref 0–37)
Albumin: 4.8 g/dL (ref 3.5–5.2)
Alkaline Phosphatase: 76 U/L (ref 39–117)
BILIRUBIN DIRECT: 0.1 mg/dL (ref 0.0–0.3)
Total Bilirubin: 0.4 mg/dL (ref 0.2–1.2)
Total Protein: 7.9 g/dL (ref 6.0–8.3)

## 2014-07-09 LAB — LDL CHOLESTEROL, DIRECT: Direct LDL: 119 mg/dL

## 2014-07-09 MED ORDER — HYDROXYZINE HCL 50 MG PO TABS: 50.0000 mg | ORAL_TABLET | Freq: Three times a day (TID) | ORAL | Status: DC | PRN

## 2014-07-09 MED ORDER — PERMETHRIN 5 % EX CREA: 1.0000 "application " | TOPICAL_CREAM | Freq: Once | CUTANEOUS | Status: DC

## 2014-07-09 NOTE — Progress Notes (Signed)
   Subjective:    Patient ID: Burns Spain, male    DOB: [birth year], 28 y.o.   MRN: [id number]  HPI Hypertriglyceridemia- pt was told to start Fenofibrate in Feb but did not pick up prescription.  Pt reports he attempted to change diet- cut back on fried foods and red meat.  'itch'- sxs started 3 weeks ago.  sxs started at waistband and is extending up side.  Traveled x2 months this summer- has been home x3 weeks.  No change in soaps, detergents.  Was previously waking up scratching.  Nothing in groin, feet.  Some itching of hands.  Lives alone.  Itching is worse at night.   Review of Systems For ROS see HPI     Objective:   Physical Exam  Vitals reviewed. Constitutional: He is oriented to person, place, and time. He appears well-developed and well-nourished. No distress.  HENT:  Head: Normocephalic and atraumatic.  Cardiovascular: Normal rate, regular rhythm, normal heart sounds and intact distal pulses.   Pulmonary/Chest: Effort normal and breath sounds normal. No respiratory distress. He has no wheezes. He has no rales.  Abdominal: Soft. Bowel sounds are normal. He exhibits no distension. There is no tenderness. There is no rebound.  Neurological: He is alert and oriented to person, place, and time.  Skin: Skin is warm and dry. Rash (faint linear markings consistent w/ scabies burrows along waistband) noted. No erythema.          Assessment & Plan:

## 2014-07-09 NOTE — Assessment & Plan Note (Signed)
Pt reports he never started Fenofibrate but did change diet.  Repeat labs today.  If levels still elevated, will attempt to get pt to take medication as previously prescribed.

## 2014-07-09 NOTE — Assessment & Plan Note (Signed)
New.  With pt's hx of extensive travel and now w/ constant itching- worse at night- will treat as scabies.  Reviewed appropriate use of Elimite cream.  Reviewed supportive care and red flags that should prompt return.  Pt expressed understanding and is in agreement w/ plan.

## 2014-07-09 NOTE — Patient Instructions (Signed)
Schedule your complete physical for Feb Apply the Elimite cream from jaw down- cover everything.  Sleep in the cream overnight, take shower in AM and wash all sheets/towels/clothes.  If no improvement after 7 days, repeat the treatment w/ the refill provided [facility] notify you of your lab results and make any changes if needed Call with any questions or concerns Happy Fall!

## 2014-07-09 NOTE — Progress Notes (Signed)
Pre visit review using our clinic review tool, if applicable. No additional management support is needed unless otherwise documented below in the visit note. 

## 2014-07-10 ENCOUNTER — Other Ambulatory Visit: Payer: Self-pay | Admitting: Family Medicine

## 2014-07-10 DIAGNOSIS — E781 Pure hyperglyceridemia: Secondary | ICD-10-CM

## 2014-07-10 MED ORDER — FENOFIBRATE 160 MG PO TABS: 160.0000 mg | ORAL_TABLET | Freq: Every day | ORAL | Status: DC

## 2014-08-20 ENCOUNTER — Other Ambulatory Visit: Payer: No Typology Code available for payment source

## 2014-12-09 ENCOUNTER — Telehealth: Payer: Self-pay

## 2014-12-09 NOTE — Telephone Encounter (Signed)
LM for patient to return the call  Medication List and allergies:  Reviewed and updated  90 day supply/mail order:  Local prescriptions:   Immunizations due:   A/P:   No changes to FH, PSH or Personal Hx Flu vaccine--utd Tdap--10/2006    To Discuss with Provider:

## 2014-12-11 ENCOUNTER — Encounter: Payer: No Typology Code available for payment source | Admitting: Family Medicine

## 2014-12-13 NOTE — Telephone Encounter (Signed)
Appt cancelled

## 2015-03-04 ENCOUNTER — Telehealth: Payer: Self-pay | Admitting: Family Medicine

## 2015-03-04 NOTE — Telephone Encounter (Signed)
Pre Visit letter sent  °

## 2015-03-25 ENCOUNTER — Telehealth: Payer: Self-pay | Admitting: *Deleted

## 2015-03-25 NOTE — Telephone Encounter (Signed)
Unable to reach patient at time of Pre-Visit Call.  Left message for patient to return call when available.    

## 2015-03-26 ENCOUNTER — Encounter: Payer: Self-pay | Admitting: Family Medicine

## 2015-03-26 DIAGNOSIS — Z0289 Encounter for other administrative examinations: Secondary | ICD-10-CM

## 2015-03-28 ENCOUNTER — Encounter: Payer: Self-pay | Admitting: Family Medicine

## 2015-03-28 ENCOUNTER — Telehealth: Payer: Self-pay | Admitting: Family Medicine

## 2015-03-28 NOTE — Telephone Encounter (Signed)
Yes- pt needs to be charged 

## 2015-03-28 NOTE — Telephone Encounter (Signed)
Pt was no show for CPE on 03/26/15. Letter sent. Charge pt?

## 2015-04-29 ENCOUNTER — Other Ambulatory Visit: Payer: Self-pay | Admitting: Family Medicine

## 2015-04-30 NOTE — Telephone Encounter (Signed)
Med filled, pt needs to schedule CPE.

## 2015-07-24 ENCOUNTER — Other Ambulatory Visit: Payer: Self-pay | Admitting: Family Medicine

## 2015-07-24 NOTE — Telephone Encounter (Signed)
Medication filled to pharmacy as requested.   

## 2015-09-02 ENCOUNTER — Other Ambulatory Visit: Payer: Self-pay | Admitting: Family Medicine

## 2015-09-03 ENCOUNTER — Telehealth: Payer: Self-pay | Admitting: Family Medicine

## 2015-09-03 NOTE — Telephone Encounter (Signed)
The omeprazole is not the issue.  His triglycerides were over 700 1 yr ago and at that time he was told he needed to start Fenofibrate and follow up.  It has been over 1 year since he has been seen and we cannot continue to give meds if he is not seen on a yearly basis.

## 2015-09-03 NOTE — Telephone Encounter (Signed)
Cannot fill medication due to pt not being seen in over a year, pt needs a cholesterol follow up before we can fill any medications.

## 2015-09-03 NOTE — Telephone Encounter (Signed)
Caller name: Self   Can be reached:[phone] [facility] [address] [place] - [id number] [phone] (Phone) [phone] (Fax)         Reason for call: Pharmacy will not refill omeprazole (PRILOSEC) 40 MG capsule [[id number]][[id number]]

## 2015-09-03 NOTE — Telephone Encounter (Signed)
Called patient and informed him of below. Patient states he does not have time to come into the office to pay $50 just for Prilosec. Request a call back from Dr. Beverely Lowabori Immediately. Very irate and determined that he should not have to come in.

## 2015-09-03 NOTE — Telephone Encounter (Signed)
Called and spoke with pt and advised of provider notation that we could not fill his medications due to pt not being seen in over a year. Pt was very upset and began yelling stating that "no one has ever told him that if he was not seen he could not get medications" I advised patient that his triglycerides were over 700 at last visit and pt was advised that he needed to begin fenofibrate and follow up in 6 months which he did not do, pt stated that "he is not taking that medicine because his doctor told him that if he changed his diet that he would be cured and he would not need the medicine any longer". Pt was advised that we could not make that recommendation without checking his labs.   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 I advised pt that the omeprazole was not the problem we were very concerned for his safety at work due to such elevated levels on his lipid panel. Conversation with patient lasted 15 minutes during which pt kept stating how "he could not come in for an appt due to being out of town for work at weeks at a time and no on ever told him that him not coming to appointments would affect him receiving medications" I advised pt that he received a no show letter in June to reschedule his missed appointment and that it was also written on his last omeprazole prescription that he needed to make a Physical appt (at minimum) to keep receiving medications. Pt proceeded to keep yelling, I advised pt 3 times that if he could not stop yelling that I would have no choice but to hang up on him. After the fourth advisement of that warning I hung up phone on patient.

## 2015-09-04 ENCOUNTER — Encounter: Payer: Self-pay | Admitting: General Practice

## 2015-09-04 NOTE — Telephone Encounter (Signed)
[facility] and inability to comply w/ office policies (yearly visits to continue medication) he will be dismissed from the practice

## 2015-09-04 NOTE — Telephone Encounter (Signed)
Paperwork started and given to provider for completion.

## 2015-09-09 ENCOUNTER — Telehealth: Payer: Self-pay | Admitting: Family Medicine

## 2015-09-09 NOTE — Telephone Encounter (Signed)
Patient dismissed from [facility] by Neena RhymesKatherine Tabori MD , effective September 04, 2015. Dismissal letter sent out by certified / registered mail.  DAJ  Received signed domestic return receipt verifying delivery of certified letter on September 12, 2015. Article number [id number]

## 2018-01-03 DIAGNOSIS — M5136 Other intervertebral disc degeneration, lumbar region: Secondary | ICD-10-CM | POA: Insufficient documentation

## 2019-04-05 DIAGNOSIS — M47816 Spondylosis without myelopathy or radiculopathy, lumbar region: Secondary | ICD-10-CM | POA: Insufficient documentation

## 2020-11-17 ENCOUNTER — Ambulatory Visit: Payer: Self-pay | Admitting: Family Medicine

## 2020-12-08 ENCOUNTER — Encounter: Payer: Self-pay | Admitting: Family Medicine

## 2020-12-08 ENCOUNTER — Other Ambulatory Visit: Payer: Self-pay

## 2020-12-08 ENCOUNTER — Ambulatory Visit (INDEPENDENT_AMBULATORY_CARE_PROVIDER_SITE_OTHER): Payer: 59 | Admitting: Family Medicine

## 2020-12-08 VITALS — BP 110/74 | HR 75 | Temp 97.0°F | Ht 78.0 in | Wt 253.0 lb

## 2020-12-08 DIAGNOSIS — F1290 Cannabis use, unspecified, uncomplicated: Secondary | ICD-10-CM | POA: Insufficient documentation

## 2020-12-08 DIAGNOSIS — E781 Pure hyperglyceridemia: Secondary | ICD-10-CM

## 2020-12-08 DIAGNOSIS — K824 Cholesterolosis of gallbladder: Secondary | ICD-10-CM

## 2020-12-08 DIAGNOSIS — M5136 Other intervertebral disc degeneration, lumbar region: Secondary | ICD-10-CM

## 2020-12-08 DIAGNOSIS — Z720 Tobacco use: Secondary | ICD-10-CM | POA: Insufficient documentation

## 2020-12-08 DIAGNOSIS — Z23 Encounter for immunization: Secondary | ICD-10-CM | POA: Diagnosis not present

## 2020-12-08 DIAGNOSIS — K219 Gastro-esophageal reflux disease without esophagitis: Secondary | ICD-10-CM | POA: Insufficient documentation

## 2020-12-08 NOTE — Progress Notes (Signed)
[facility] [address] [place] [address] Dept: [phone] Dept Fax: [phone]  New Patient Office Visit  Subjective:    Patient ID: Bradley Holden, male    DOB: 1986-04-29, 35 y.o..   MRN: [id number]  Chief Complaint  Patient presents with  . Establish Care    New patient Declines flu vaccine    History of Present Illness:  Patient is in today to establish care. 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 He has a past history of hypertriglyceridemia and was previously treated with fenofibrate. He has been off of this medication for quite some time.   [name] has a history of degenerative lumbar disc disease. He sees Dr. Ethelene Hal with [facility]. He notes he has recently received an epidural steroid injection for this.  [name] has a past history of having 2 gallbladder polyps. These were discovered at a time when he was having dyspepsia issues. The decision was that these were asymptomatic, so did not necessitate a cholecystectomy. He does not feel that he has any symptoms related to these.  [name] states he quit smoking 2-3 years ago, but admits he still smokes a few cigarettes, when he drinks beer (about once a week). He also admits to marijuana use about once a week, primarily edibles.  Past Medical History: Patient Active Problem List   Diagnosis Date Noted  . GERD (gastroesophageal reflux disease) 12/08/2020  . Marijuana use 12/08/2020  . Tobacco abuse, episodic 12/08/2020  . Lumbar spondylosis 04/05/2019  . Degeneration of lumbar intervertebral disc 01/03/2018  . Gallbladder polyp 12/25/2013  . Abdominal bloating 12/12/2013  . Hypertriglyceridemia 12/12/2013   Past Surgical History:  Procedure Laterality Date  . APPENDECTOMY  2009   Family History  Problem Relation Age of Onset  . Arthritis Mother   . Arthritis Father   . Prostate cancer Maternal Grandfather    . Diabetes Other    Outpatient Medications Prior to Visit  Medication Sig Dispense Refill  . hydrOXYzine (ATARAX/VISTARIL) 50 MG tablet Take 1 tablet (50 mg total) by mouth 3 (three) times daily as needed for itching. 60 tablet 0  . omeprazole (PRILOSEC) 40 MG capsule take 1 capsule by mouth once daily 30 capsule 0  . permethrin (ELIMITE) 5 % cream Apply 1 application topically once. 60 g 1  . simethicone (GAS-X) 80 MG chewable tablet Chew 1 tablet (80 mg total) by mouth every 6 (six) hours as needed for flatulence. 30 tablet 0  . fenofibrate 160 MG tablet Take 1 tablet (160 mg total) by mouth daily. (Patient not taking: Reported on 12/08/2020) 30 tablet 6   No facility-administered medications prior to visit.    No Known Allergies    Objective:   Today's Vitals   12/08/20 1307  BP: 110/74  Pulse: 75  Temp: (!) 97 F (36.1 C)  TempSrc: Temporal  SpO2: 98%  Weight: 253 lb (114.8 kg)  Height: 6\' 6"  (1.981 m)   Body mass index is 29.24 kg/m.   General: Well developed, well nourished. No acute distress. Lungs: Clear to auscultation bilaterally. CV: RRR without murmurs or rubs. Pulses 2+ bilaterally. Abdomen: Soft, non-tender. No hepatosplenomegaly. No rebound or guarding. Psych: Alert and oriented. Normal mood and affect.  Health Maintenance Due  Topic Date Due  . Hepatitis C Screening  Never done  . COVID-19 Vaccine (1) Never done  . HIV Screening  Never done  . TETANUS/TDAP  10/18/2016  . INFLUENZA VACCINE  Never done     Assessment & Plan:   1. Hypertriglyceridemia We will check a lipid panel to see where he is with his levels currently.  - Lipid panel  2. Gallbladder polyp I will order an ultrasound to assess the current status of the previosuly noted polyps.  - US Abdomen Limited RUQ (LIVER/GB); Future  3. Degeneration of lumbar intervertebral disc Stable. Continue to follow with Dr. Ethelene Hal.  4. Marijuana use Noted.  5. Tobacco abuse, episodic We  discussed the status of his smoking. I recommended that he stop. His current level of smoking would suggest there is not a need for medication assistance for him to stop.  Loyola Mast, MD

## 2020-12-09 LAB — LIPID PANEL
Cholesterol: 242 mg/dL — ABNORMAL HIGH (ref 0–200)
HDL: 37.6 mg/dL — ABNORMAL LOW (ref >39.00)
Total CHOL/HDL Ratio: 6
Triglycerides: 738 mg/dL — ABNORMAL HIGH (ref 0.0–149.0)

## 2020-12-09 LAB — LDL CHOLESTEROL, DIRECT: Direct LDL: 117 mg/dL

## 2020-12-23 ENCOUNTER — Ambulatory Visit
Admission: RE | Admit: 2020-12-23 | Discharge: 2020-12-23 | Disposition: A | Discharge status: Home / Self Care | Payer: 59 | Source: Ambulatory Visit | Attending: Family Medicine | Admitting: Family Medicine

## 2020-12-23 DIAGNOSIS — K824 Cholesterolosis of gallbladder: Secondary | ICD-10-CM

## 2020-12-24 ENCOUNTER — Encounter: Payer: Self-pay | Admitting: Family Medicine

## 2020-12-24 DIAGNOSIS — K760 Fatty (change of) liver, not elsewhere classified: Secondary | ICD-10-CM | POA: Insufficient documentation

## 2020-12-26 ENCOUNTER — Telehealth: Payer: Self-pay | Admitting: Family Medicine

## 2020-12-26 DIAGNOSIS — K760 Fatty (change of) liver, not elsewhere classified: Secondary | ICD-10-CM

## 2020-12-26 DIAGNOSIS — E781 Pure hyperglyceridemia: Secondary | ICD-10-CM

## 2020-12-26 MED ORDER — FENOFIBRATE 48 MG PO TABS: 48.0000 mg | ORAL_TABLET | Freq: Every day | ORAL | 3 refills | Status: DC

## 2020-12-26 NOTE — Telephone Encounter (Signed)
Note already entered.

## 2020-12-26 NOTE — Telephone Encounter (Signed)
I called discussed the recent lipid panel results and the findings on the recent RUQ ultrasound. We reviewed the concern for possible non-al cholic fatty liver disease. I discussed the risk for development of non-alcoholic steatohepatitis and cirrhosis. I recommend continued efforts at weight loss through exercise. We did discuss starting fenofibrate to reduce the hypertriglyceridemia and reassessing in 6 months.

## 2021-05-07 DIAGNOSIS — M5416 Radiculopathy, lumbar region: Secondary | ICD-10-CM | POA: Insufficient documentation

## 2021-12-01 ENCOUNTER — Ambulatory Visit: Payer: Self-pay | Admitting: Family Medicine

## 2021-12-01 ENCOUNTER — Telehealth: Payer: Self-pay | Admitting: Family Medicine

## 2021-12-01 NOTE — Telephone Encounter (Signed)
Pt did not show for app. I sent No Show letter.

## 2021-12-09 ENCOUNTER — Telehealth: Payer: Self-pay | Admitting: Family Medicine

## 2021-12-09 ENCOUNTER — Encounter: Payer: Self-pay | Admitting: Family Medicine

## 2021-12-09 NOTE — Telephone Encounter (Signed)
Patient/Caregiver was notified of No Show/Late Cancellation Policy & possible $50 charge. Visit was cancelled with reason "No Show/Cancel within 24 hours" for tracking & charging.  Caller Name: Aarnav Steagall Caller Ph #: [phone] Date of APPT: 12/09/21 Reason given for no show/late cancellation: pt has covid.Marland Kitchen resched.   ~~~Route message to Horticulturist, commercial and clinical team/CMA~~~

## 2021-12-29 NOTE — Telephone Encounter (Signed)
1st no show

## 2022-01-01 ENCOUNTER — Encounter: Payer: Self-pay | Admitting: Family Medicine

## 2022-01-14 ENCOUNTER — Encounter: Payer: Self-pay | Admitting: Family Medicine

## 2022-01-14 ENCOUNTER — Ambulatory Visit (INDEPENDENT_AMBULATORY_CARE_PROVIDER_SITE_OTHER): Payer: BLUE CROSS/BLUE SHIELD | Admitting: Family Medicine

## 2022-01-14 VITALS — BP 126/80 | HR 65 | Temp 97.5°F | Ht 78.0 in | Wt 251.6 lb

## 2022-01-14 DIAGNOSIS — Z720 Tobacco use: Secondary | ICD-10-CM | POA: Diagnosis not present

## 2022-01-14 DIAGNOSIS — E781 Pure hyperglyceridemia: Secondary | ICD-10-CM | POA: Diagnosis not present

## 2022-01-14 DIAGNOSIS — K760 Fatty (change of) liver, not elsewhere classified: Secondary | ICD-10-CM

## 2022-01-14 DIAGNOSIS — K219 Gastro-esophageal reflux disease without esophagitis: Secondary | ICD-10-CM

## 2022-01-14 LAB — COMPREHENSIVE METABOLIC PANEL
ALT: 54 U/L — ABNORMAL HIGH (ref 0–53)
AST: 30 U/L (ref 0–37)
Albumin: 4.8 g/dL (ref 3.5–5.2)
Alkaline Phosphatase: 65 U/L (ref 39–117)
BUN: 17 mg/dL (ref 6–23)
CO2: 26 mEq/L (ref 19–32)
Calcium: 9.7 mg/dL (ref 8.4–10.5)
Chloride: 104 mEq/L (ref 96–112)
Creatinine, Ser: 1.04 mg/dL (ref 0.40–1.50)
GFR: 92.22 mL/min (ref >60.00)
Glucose, Bld: 111 mg/dL — ABNORMAL HIGH (ref 70–99)
Potassium: 4.2 mEq/L (ref 3.5–5.1)
Sodium: 138 mEq/L (ref 135–145)
Total Bilirubin: 0.4 mg/dL (ref 0.2–1.2)
Total Protein: 7.2 g/dL (ref 6.0–8.3)

## 2022-01-14 LAB — LIPID PANEL
Cholesterol: 203 mg/dL — ABNORMAL HIGH (ref 0–200)
HDL: 31.3 mg/dL — ABNORMAL LOW (ref >39.00)
NonHDL: 171.92
Total CHOL/HDL Ratio: 6
Triglycerides: 387 mg/dL — ABNORMAL HIGH (ref 0.0–149.0)
VLDL: 77.4 mg/dL — ABNORMAL HIGH (ref 0.0–40.0)

## 2022-01-14 LAB — LDL CHOLESTEROL, DIRECT: Direct LDL: 121 mg/dL

## 2022-01-14 MED ORDER — OMEPRAZOLE 40 MG PO CPDR: 40.0000 mg | DELAYED_RELEASE_CAPSULE | Freq: Every day | ORAL | 3 refills | Status: DC

## 2022-01-14 MED ORDER — FENOFIBRATE 48 MG PO TABS: 48.0000 mg | ORAL_TABLET | Freq: Every day | ORAL | 3 refills | Status: DC

## 2022-01-14 NOTE — Progress Notes (Signed)
?[facility] ?[facility] ?[address] ?[place] [address] ?Dept: [phone] ?Dept Fax: [phone] ? ?Annual Physical Visit ? ?Subjective:  ? ? Patient ID: Bradley Holden, male    DOB: 1986-07-11, 36 y.o..   MRN: [id number] ? ?Chief Complaint  ?Patient presents with  ? Annual Exam  ?  CPE/labs. No concerns.  Fasting today.    ? ? ?History of Present Illness: ? ?Patient is in today for an annual physical/preventative visit. ? ?Mr. Alcoser notes that he has done well over the past year. No significant new issues. ? ?mr. Mcfarland has a history of hypertriglyceridemia. he notes that he has picked up his physical activity in the past year. he has been inconsistent with this Tricor use. he admits he has been off this for some time. He continues to use Prilosec regularly to control his acid reflux. this is stable. ? ?Review of Systems  ?Constitutional: Negative.   ?HENT:  Positive for congestion. Negative for ear discharge, ear pain, hearing loss, sinus pain and sore throat.   ?     Some seasonal allergy issues, but this is manageable.  ?Eyes: Negative.   ?Respiratory:  Negative for cough, shortness of breath and wheezing.   ?Cardiovascular:  Negative for chest pain, palpitations and leg swelling.  ?Gastrointestinal:  Positive for heartburn. Negative for abdominal pain, constipation, diarrhea, nausea and vomiting.  ?     As noted above.  ?Musculoskeletal:  Negative for back pain, joint pain, myalgias and neck pain.  ?     Injured his left 4th finger last year. managed this on his own with a splint.  ?Skin: Negative.   ?Neurological: Negative.   ?Endo/Heme/Allergies:  Positive for environmental allergies.  ?Psychiatric/Behavioral:  Negative for depression. The patient is not nervous/anxious.   ? ?Past Medical History: ?Patient Active Problem List  ? Diagnosis Date Noted  ? Lumbar radiculopathy 05/07/2021  ? Fatty liver 12/24/2020  ? GERD (gastroesophageal reflux disease)  12/08/2020  ? Marijuana use 12/08/2020  ? Tobacco abuse, episodic 12/08/2020  ? Lumbar spondylosis 04/05/2019  ? Degeneration of lumbar intervertebral disc 01/03/2018  ? Gallbladder polyp 12/25/2013  ? Abdominal bloating 12/12/2013  ? Hypertriglyceridemia 12/12/2013  ? ?Past Surgical History:  ?Procedure Laterality Date  ? APPENDECTOMY  2009  ? ?Family History  ?Problem Relation Age of Onset  ? Arthritis Mother   ? Arthritis Father   ? Prostate cancer Maternal Grandfather   ? Diabetes Other   ? ?Outpatient Medications Prior to Visit  ?Medication Sig Dispense Refill  ? cyclobenzaprine (FLEXERIL) 10 MG tablet cyclobenzaprine 10 mg tablet ? TAKE 1 TABLET 3 TIMES A DAY BY ORAL ROUTE.    ? ketoconazole (NIZORAL) 2 % shampoo ketoconazole 2 % shampoo ? FOR BODY RASH USE AS BODY WASH, APPLY FROM NECK TO KNEES, LET SIT AND RINSE. REPEAT IN 7-10 DAYS    ? omeprazole (PRILOSEC) 40 MG capsule take 1 capsule by mouth once daily 30 capsule 0  ? fenofibrate (TRICOR) 48 MG tablet Take 1 tablet (48 mg total) by mouth daily. (Patient not taking: Reported on 01/14/2022) 90 tablet 3  ? ?No facility-administered medications prior to visit.  ? ?No Known Allergies ?   ?Objective:  ? ?Today's Vitals  ? 01/14/22 1036  ?BP: 126/80  ?Pulse: 65  ?Temp: (!) 97.5 ?F (36.4 ?C)  ?TempSrc: Temporal  ?SpO2: 98%  ?Weight: 251 lb 9.6 oz (114.1 kg)  ?Height: 6\' 6"  (1.981 m)  ? ?Body mass index  is 29.08 kg/m?.  ? ?General: Well developed, well nourished. No acute distress. ?HEENT: Normocephalic, non-traumatic. PERRL, EOMI. Conjunctiva clear.External ears normal. EAC and TMs normal bilaterally. Nose   ? clear without congestion or rhinorrhea. Mucous membranes moist. Oropharynx clear. Good dentition. ?Neck: Supple. No lymphadenopathy. No thyromegaly. ?Lungs: Clear to auscultation bilaterally. No wheezing, rales or rhonchi. ?CV: RRR without murmurs or rubs. Pulses 2+ bilaterally. ?Abdomen: Soft, non-tender. Bowel sounds positive, normal pitch and frequency.  No hepatosplenomegaly. No rebound or guarding. ?Back: Straight. No CVA tenderness bilaterally. ?Extremities: Full ROM. No joint swelling or tenderness. No edema. Left 4th finger with mild deformity, but good ROM. ?Skin: Warm and dry. No rashes. ?Neuro: No focal neurological deficits. ?Psych: Alert and oriented. Normal mood and affect. ? ?Health Maintenance Due  ?Topic Date Due  ? HIV Screening  Never done  ? Hepatitis C Screening  Never done  ? COVID-19 Vaccine (4 - Booster for Pfizer series) 12/10/2020  ? INFLUENZA VACCINE  Never done  ?   ?Assessment & Plan:  ? ?1. Hypertriglyceridemia ?Due for follow up lipid profile. He will return fasting. I will renew his Tricor. ? ?- fenofibrate (TRICOR) 48 MG tablet; Take 1 tablet (48 mg total) by mouth daily.  Dispense: 90 tablet; Refill: 3 ?- Lipid panel ? ?2. Gastroesophageal reflux disease without esophagitis ?Stable on Prilosec. ? ?- omeprazole (PRILOSEC) 40 MG capsule; Take 1 capsule (40 mg total) by mouth daily.  Dispense: 90 capsule; Refill: 3 ? ?3. Fatty liver ?I will reassess his LFTs. ? ?- Comprehensive metabolic panel ? ?4. Tobacco abuse, episodic ?Advised to stop smoking. Use is light, so medications not indicated. ? ? ?Return in about 1 year (around 01/15/2023) for Reassessment.  ? ?Loyola Mast, MD ?

## 2023-04-11 ENCOUNTER — Other Ambulatory Visit: Payer: Self-pay | Admitting: Family Medicine

## 2023-04-11 DIAGNOSIS — K219 Gastro-esophageal reflux disease without esophagitis: Secondary | ICD-10-CM

## 2023-04-11 DIAGNOSIS — E781 Pure hyperglyceridemia: Secondary | ICD-10-CM

## 2023-08-24 ENCOUNTER — Encounter: Payer: Self-pay | Admitting: Family Medicine

## 2023-08-24 ENCOUNTER — Ambulatory Visit (HOSPITAL_COMMUNITY): Payer: 59

## 2023-08-24 ENCOUNTER — Ambulatory Visit: Payer: 59 | Admitting: Family Medicine

## 2023-08-24 VITALS — BP 118/76 | HR 118 | Temp 98.5°F | Ht 78.0 in | Wt 250.4 lb

## 2023-08-24 DIAGNOSIS — R1032 Left lower quadrant pain: Secondary | ICD-10-CM

## 2023-08-24 DIAGNOSIS — N289 Disorder of kidney and ureter, unspecified: Secondary | ICD-10-CM | POA: Diagnosis not present

## 2023-08-24 NOTE — Patient Instructions (Signed)
Diverticulitis  Diverticulitis happens when poop (stool) and bacteria get trapped in small pouches in the colon called diverticula. These pouches may form if you have a condition called diverticulosis. When the poop and bacteria get trapped, it can cause an infection and inflammation. Diverticulitis may cause severe stomach pain and diarrhea. It can also lead to tissue damage in your colon. This can cause bleeding or blockage. In some cases, the diverticula may burst (rupture). This can cause infected poop to go into other parts of your abdomen. What are the causes? This condition is caused by poop getting trapped in the diverticula. This allows bacteria to grow. It can lead to inflammation and infection. What increases the risk? You are more likely to get this condition if you have diverticulosis. You are also more at risk if: You are overweight or obese. You do not get enough exercise. You drink alcohol. You smoke. You eat a lot of red meat, such as beef, pork, or lamb. You do not get enough fiber. Foods high in fiber include fruits, vegetables, beans, nuts, and whole grains. You are over 38 years of age. What are the signs or symptoms? Symptoms of this condition may include: Pain and tenderness in the abdomen. This pain is often felt on the left side but may occur in other spots. Fever and chills. Nausea and vomiting. Cramping. Bloating. Changes in how often you poop. Blood in your poop. How is this diagnosed? This condition is diagnosed based on your medical history and a physical exam. You may also have tests done to make sure there is nothing else causing your condition. These tests may include: Blood tests. Tests done on your pee (urine). A CT scan of the abdomen. You may need to have a colonoscopy. This is an exam to look at your whole large intestine. During the exam, a tube is put into the opening of your butt (anus) and then moved into your rectum, colon, and other parts of  the large intestine. This exam is done to look at the diverticula. It can also see if there is something else that may be causing your symptoms. How is this treated? Most cases are mild and can be treated at home. You may be told to: Take over-the-counter pain medicine. Only eat and drink clear liquids. Rest. More severe cases may need to be treated at a hospital. Treatment may include: Not eating or drinking. Taking pain medicines. Getting antibiotics through an IV. Getting fluids and nutrition through an IV. Surgery. Follow these instructions at home: Medicines Take over-the-counter and prescription medicines only as told by your health care provider. These include fiber supplements, probiotics, and medicines to soften your poop (stool softeners). If you were prescribed antibiotics, take them as told by your provider. Do not stop using the antibiotic even if you start to feel better. Ask your provider if the medicine prescribed to you requires you to avoid driving or using machinery. Eating and drinking  Follow the diet told by your provider. You may need to only eat and drink liquids. After your symptoms get better, you may be able to return to a more normal diet. You may be told to eat at least 25 grams (25 g) of fiber each day. Fiber makes it easier to poop. Healthy sources of fiber include: Berries. One cup has 4-8 g of fiber. Beans or lentils. One-half cup has 5-8 g of fiber. Green vegetables. One cup has 4 g of fiber. Avoid eating red meat. General instructions  Do not use any products that contain nicotine or tobacco. These products include cigarettes, chewing tobacco, and vaping devices, such as e-cigarettes. If you need help quitting, ask your provider. Exercise for at least 30 minutes, 3 times a week. Exercise hard enough to raise your heart rate and break a sweat. Contact a health care provider if: Your pain gets worse. Your pooping does not go back to normal. Your  symptoms do not get better with treatment. Your symptoms get worse all of a sudden. You have a fever. You vomit more than one time. Your poop is bloody, black, or tarry. This information is not intended to replace advice given to you by your health care provider. Make sure you discuss any questions you have with your health care provider. Document Revised: 07/01/2022 Document Reviewed: 07/01/2022 Elsevier Patient Education  2024 [employer].

## 2023-08-24 NOTE — Assessment & Plan Note (Signed)
Bradley Holden has LLQ pain with guarding and rebound, constipation, and mild tachycardia. Other vital signs are normal. I will order a CBC, CMP, and UA. I will request an urgent CT scan of the abdomen. I am most suspicious for acute diverticulitis, but would also consider a possible kidney stone.  I did discuss with him about following a clear liquid diet for 48 hours and taking Tylenol or ibuprofen for pain. We will determine further management after the CT scan.

## 2023-08-24 NOTE — Progress Notes (Signed)
[facility] [address] [place] [address] Dept: [phone] Dept Fax: [phone]  Office Visit  Subjective:    Patient ID: Bradley Holden, male    DOB: 07-Nov-1985, 37 y.o..   MRN: [id number]  Chief Complaint  Patient presents with   GI Problem    C/o having stomach pain LT side x 2 days.  Has taken a laxative.     History of Present Illness:  Patient is in today complaining of a 3 day history of LLQ abdominal pain and constipation. He notes he had been feeling well over the weekend. He has not eaten any food he thought might have precipitated this. He had no appetite on Sunday. As he had been several days without a bowel movement, he took a laxative yesterday. He notes he had some watery stool, but nothing solid. Bradley Holden states this feels much like his acute appendicitis did a few years ago, just on the opposite side. His mother does have a history of prior diverticulitis. He has had no CVA tenderness and is not having any hematuria. He denies nausea or vomiting.  Past Medical History: Patient Active Problem List   Diagnosis Date Noted   Lumbar radiculopathy 05/07/2021   Fatty liver 12/24/2020   GERD (gastroesophageal reflux disease) 12/08/2020   Marijuana use 12/08/2020   Tobacco abuse, episodic 12/08/2020   Lumbar spondylosis 04/05/2019   Degeneration of lumbar intervertebral disc 01/03/2018   Gallbladder polyp 12/25/2013   Abdominal bloating 12/12/2013   Hypertriglyceridemia 12/12/2013   Past Surgical History:  Procedure Laterality Date   APPENDECTOMY  2009   Family History  Problem Relation Age of Onset   Arthritis Mother    Arthritis Father    Prostate cancer Maternal Grandfather    Diabetes Other    Outpatient Medications Prior to Visit  Medication Sig Dispense Refill   fenofibrate (TRICOR) 48 MG tablet TAKE 1 TABLET BY MOUTH EVERY DAY 90 tablet 3   omeprazole (PRILOSEC) 40 MG capsule TAKE 1  CAPSULE (40 MG TOTAL) BY MOUTH DAILY. 90 capsule 3   No facility-administered medications prior to visit.   No Known Allergies   Objective:   Today's Vitals   08/24/23 1505  BP: 118/76  Pulse: (!) 118  Temp: 98.5 F (36.9 C)  TempSrc: Temporal  SpO2: 97%  Weight: 250 lb 6.4 oz (113.6 kg)  Height: 6\' 6"  (1.981 m)   Body mass index is 28.94 kg/m.   General: Well developed, well nourished. No acute distress. Abdomen: Soft. Bowel sounds positive, but of decreased frequency. No hepatosplenomegaly. Pain with palpation over   the LLQ. Mild to moderate rebound and guarding. Back: Straight. No CVA tenderness bilaterally. Psych: Alert and oriented. Normal mood and affect.  Health Maintenance Due  Topic Date Due   HIV Screening  Never done   Hepatitis C Screening  Never done     Assessment & Plan:   Problem List Items Addressed This Visit       Other   Acute left lower quadrant pain - Primary    Bradley Holden has LLQ pain with guarding and rebound, constipation, and mild tachycardia. Other vital signs are normal. I will order a CBC, CMP, and UA. I will request an urgent CT scan of the abdomen. I am most suspicious for acute diverticulitis, but would also consider a possible kidney stone.  I did discuss with him about following a clear liquid diet for 48 hours and taking Tylenol or  ibuprofen for pain. We will determine further management after the CT scan.      Relevant Orders   CBC with Differential/Platelet   Comprehensive metabolic panel   Urinalysis w microscopic + reflex cultur   CT ABDOMEN PELVIS WO CONTRAST    Return for Follow-up after testing/imaging.   Loyola Mast, MD

## 2023-08-25 ENCOUNTER — Ambulatory Visit (HOSPITAL_COMMUNITY)
Admission: RE | Admit: 2023-08-25 | Discharge: 2023-08-25 | Disposition: A | Discharge status: Home / Self Care | Payer: 59 | Source: Ambulatory Visit | Attending: Family Medicine | Admitting: Family Medicine

## 2023-08-25 ENCOUNTER — Encounter: Payer: Self-pay | Admitting: Family Medicine

## 2023-08-25 DIAGNOSIS — K5790 Diverticulosis of intestine, part unspecified, without perforation or abscess without bleeding: Secondary | ICD-10-CM | POA: Insufficient documentation

## 2023-08-25 DIAGNOSIS — N200 Calculus of kidney: Secondary | ICD-10-CM | POA: Insufficient documentation

## 2023-08-25 DIAGNOSIS — N2889 Other specified disorders of kidney and ureter: Secondary | ICD-10-CM | POA: Insufficient documentation

## 2023-08-25 DIAGNOSIS — R1032 Left lower quadrant pain: Secondary | ICD-10-CM | POA: Insufficient documentation

## 2023-08-25 LAB — CBC WITH DIFFERENTIAL/PLATELET
Basophils Absolute: 0.1 10*3/uL (ref 0.0–0.1)
Basophils Relative: 0.7 % (ref 0.0–3.0)
Eosinophils Absolute: 0.2 10*3/uL (ref 0.0–0.7)
Eosinophils Relative: 1.3 % (ref 0.0–5.0)
HCT: 47.2 % (ref 39.0–52.0)
Hemoglobin: 15.9 g/dL (ref 13.0–17.0)
Lymphocytes Relative: 13.4 % (ref 12.0–46.0)
Lymphs Abs: 1.6 10*3/uL (ref 0.7–4.0)
MCHC: 33.6 g/dL (ref 30.0–36.0)
MCV: 93.1 fL (ref 78.0–100.0)
Monocytes Absolute: 2 10*3/uL — ABNORMAL HIGH (ref 0.1–1.0)
Monocytes Relative: 16.5 % — ABNORMAL HIGH (ref 3.0–12.0)
Neutro Abs: 8.3 10*3/uL — ABNORMAL HIGH (ref 1.4–7.7)
Neutrophils Relative %: 68.1 % (ref 43.0–77.0)
Platelets: 253 10*3/uL (ref 150.0–400.0)
RBC: 5.07 Mil/uL (ref 4.22–5.81)
RDW: 12.5 % (ref 11.5–15.5)
WBC: 12.1 10*3/uL — ABNORMAL HIGH (ref 4.0–10.5)

## 2023-08-25 LAB — COMPREHENSIVE METABOLIC PANEL
ALT: 45 U/L (ref 0–53)
AST: 17 U/L (ref 0–37)
Albumin: 4.6 g/dL (ref 3.5–5.2)
Alkaline Phosphatase: 82 U/L (ref 39–117)
BUN: 10 mg/dL (ref 6–23)
CO2: 26 meq/L (ref 19–32)
Calcium: 9.8 mg/dL (ref 8.4–10.5)
Chloride: 100 meq/L (ref 96–112)
Creatinine, Ser: 1.06 mg/dL (ref 0.40–1.50)
GFR: 89.12 mL/min (ref >60.00)
Glucose, Bld: 100 mg/dL — ABNORMAL HIGH (ref 70–99)
Potassium: 4.2 meq/L (ref 3.5–5.1)
Sodium: 135 meq/L (ref 135–145)
Total Bilirubin: 1.1 mg/dL (ref 0.2–1.2)
Total Protein: 7.2 g/dL (ref 6.0–8.3)

## 2023-08-25 NOTE — Addendum Note (Signed)
Addended by: Loyola Mast on: 08/25/2023 08:22 AM   Modules accepted: Orders

## 2023-08-26 ENCOUNTER — Telehealth: Payer: Self-pay | Admitting: Family Medicine

## 2023-08-26 NOTE — Telephone Encounter (Signed)
Spoke to patient and he wanted to know what he can eat since he has been doing a liquid diet for last 3 days.   Advised to slowly add things into his diet as tolerated. Dm/cma

## 2023-08-26 NOTE — Telephone Encounter (Signed)
Pt called for the results of his labs and CT scan. He was also asking about the MRI.

## 2023-09-02 ENCOUNTER — Other Ambulatory Visit (HOSPITAL_COMMUNITY): Payer: 59
